# Patient Record
Sex: Male | Born: 1970 | Race: White | Hispanic: No | Marital: Married | Smoking: Former smoker
Health system: Southern US, Community
[De-identification: ages and names within clinical notes are randomized; demographics above are authoritative.]

## PROBLEM LIST (undated history)

## (undated) DIAGNOSIS — M1990 Unspecified osteoarthritis, unspecified site: Secondary | ICD-10-CM

## (undated) DIAGNOSIS — E785 Hyperlipidemia, unspecified: Secondary | ICD-10-CM

## (undated) DIAGNOSIS — Z9989 Dependence on other enabling machines and devices: Secondary | ICD-10-CM

## (undated) DIAGNOSIS — R7303 Prediabetes: Secondary | ICD-10-CM

## (undated) DIAGNOSIS — F32A Depression, unspecified: Secondary | ICD-10-CM

## (undated) DIAGNOSIS — G549 Nerve root and plexus disorder, unspecified: Secondary | ICD-10-CM

## (undated) DIAGNOSIS — R011 Cardiac murmur, unspecified: Secondary | ICD-10-CM

## (undated) DIAGNOSIS — F329 Major depressive disorder, single episode, unspecified: Secondary | ICD-10-CM

## (undated) DIAGNOSIS — J439 Emphysema, unspecified: Secondary | ICD-10-CM

## (undated) DIAGNOSIS — G540 Brachial plexus disorders: Secondary | ICD-10-CM

## (undated) DIAGNOSIS — G2581 Restless legs syndrome: Secondary | ICD-10-CM

## (undated) DIAGNOSIS — R519 Headache, unspecified: Secondary | ICD-10-CM

## (undated) DIAGNOSIS — F419 Anxiety disorder, unspecified: Secondary | ICD-10-CM

## (undated) DIAGNOSIS — I509 Heart failure, unspecified: Secondary | ICD-10-CM

## (undated) DIAGNOSIS — I2510 Atherosclerotic heart disease of native coronary artery without angina pectoris: Secondary | ICD-10-CM

## (undated) DIAGNOSIS — K219 Gastro-esophageal reflux disease without esophagitis: Secondary | ICD-10-CM

## (undated) DIAGNOSIS — R0609 Other forms of dyspnea: Secondary | ICD-10-CM

## (undated) DIAGNOSIS — Z720 Tobacco use: Secondary | ICD-10-CM

## (undated) DIAGNOSIS — J849 Interstitial pulmonary disease, unspecified: Secondary | ICD-10-CM

## (undated) DIAGNOSIS — R351 Nocturia: Secondary | ICD-10-CM

## (undated) DIAGNOSIS — G4733 Obstructive sleep apnea (adult) (pediatric): Secondary | ICD-10-CM

## (undated) DIAGNOSIS — J449 Chronic obstructive pulmonary disease, unspecified: Secondary | ICD-10-CM

## (undated) DIAGNOSIS — N210 Calculus in bladder: Secondary | ICD-10-CM

## (undated) DIAGNOSIS — G4730 Sleep apnea, unspecified: Secondary | ICD-10-CM

## (undated) DIAGNOSIS — R0600 Dyspnea, unspecified: Secondary | ICD-10-CM

## (undated) DIAGNOSIS — Z9289 Personal history of other medical treatment: Secondary | ICD-10-CM

## (undated) DIAGNOSIS — Z8719 Personal history of other diseases of the digestive system: Secondary | ICD-10-CM

## (undated) DIAGNOSIS — R51 Headache: Secondary | ICD-10-CM

## (undated) DIAGNOSIS — M797 Fibromyalgia: Secondary | ICD-10-CM

## (undated) HISTORY — DX: Cardiac murmur, unspecified: R01.1

## (undated) HISTORY — PX: EXPLORATORY LAPAROTOMY: SUR591

## (undated) HISTORY — DX: Brachial plexus disorders: G54.0

## (undated) HISTORY — DX: Atherosclerotic heart disease of native coronary artery without angina pectoris: I25.10

## (undated) HISTORY — DX: Tobacco use: Z72.0

## (undated) HISTORY — DX: Emphysema, unspecified: J43.9

## (undated) HISTORY — DX: Other forms of dyspnea: R06.09

## (undated) HISTORY — DX: Heart failure, unspecified: I50.9

## (undated) HISTORY — DX: Chronic obstructive pulmonary disease, unspecified: J44.9

## (undated) HISTORY — DX: Nerve root and plexus disorder, unspecified: G54.9

## (undated) HISTORY — DX: Sleep apnea, unspecified: G47.30

## (undated) HISTORY — DX: Dyspnea, unspecified: R06.00

## (undated) HISTORY — DX: Anxiety disorder, unspecified: F41.9

## (undated) HISTORY — DX: Restless legs syndrome: G25.81

## (undated) HISTORY — DX: Fibromyalgia: M79.7

## (undated) HISTORY — PX: APPENDECTOMY: SHX54

## (undated) HISTORY — DX: Unspecified osteoarthritis, unspecified site: M19.90

## (undated) HISTORY — PX: COLONOSCOPY: SHX174

---
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
## 1999-01-19 ENCOUNTER — Emergency Department (HOSPITAL_COMMUNITY): Admission: EM | Admit: 1999-01-19 | Discharge: 1999-01-20 | Payer: Self-pay | Admitting: Emergency Medicine

## 2012-06-16 HISTORY — PX: EXPLORATORY LAPAROTOMY: SUR591

## 2013-06-16 DIAGNOSIS — I2510 Atherosclerotic heart disease of native coronary artery without angina pectoris: Secondary | ICD-10-CM

## 2013-06-16 HISTORY — DX: Atherosclerotic heart disease of native coronary artery without angina pectoris: I25.10

## 2013-06-16 HISTORY — PX: CARDIAC CATHETERIZATION: SHX172

## 2014-06-16 HISTORY — PX: OTHER SURGICAL HISTORY: SHX169

## 2014-12-08 ENCOUNTER — Encounter: Payer: Self-pay | Admitting: Internal Medicine

## 2014-12-08 ENCOUNTER — Encounter (INDEPENDENT_AMBULATORY_CARE_PROVIDER_SITE_OTHER): Payer: Self-pay

## 2014-12-08 ENCOUNTER — Ambulatory Visit (INDEPENDENT_AMBULATORY_CARE_PROVIDER_SITE_OTHER): Payer: BLUE CROSS/BLUE SHIELD | Admitting: Internal Medicine

## 2014-12-08 VITALS — BP 114/74 | HR 64 | Temp 98.0°F | Ht 70.0 in | Wt 193.6 lb

## 2014-12-08 DIAGNOSIS — Z720 Tobacco use: Secondary | ICD-10-CM

## 2014-12-08 DIAGNOSIS — F17210 Nicotine dependence, cigarettes, uncomplicated: Secondary | ICD-10-CM

## 2014-12-08 DIAGNOSIS — R0602 Shortness of breath: Secondary | ICD-10-CM | POA: Diagnosis not present

## 2014-12-08 DIAGNOSIS — R0600 Dyspnea, unspecified: Secondary | ICD-10-CM

## 2014-12-08 DIAGNOSIS — R05 Cough: Secondary | ICD-10-CM | POA: Diagnosis not present

## 2014-12-08 DIAGNOSIS — R058 Other specified cough: Secondary | ICD-10-CM

## 2014-12-08 NOTE — Patient Instructions (Addendum)
Stop anoro and arnuity  Be sure you are taking nexium 40 mg Take 30- 60 min before your first and last meals of the day and pepcid 20 mg at bedtime until return  GERD (REFLUX)  is an extremely common cause of respiratory symptoms just like yours , many times with no obvious heartburn at all.    It can be treated with medication, but also with lifestyle changes including elevation of the head of your bed (ideally with 6 inch  bed blocks),  Smoking cessation, avoidance of late meals, excessive alcohol, and avoid fatty foods, chocolate, peppermint, colas, red wine, and acidic juices such as orange juice.  NO MINT OR MENTHOL PRODUCTS SO NO COUGH DROPS  USE SUGARLESS CANDY INSTEAD (Jolley ranchers or Stover's or Life Savers) or even ice chips will also do - the key is to swallow to prevent all throat clearing. NO OIL BASED VITAMINS - use powdered substitutes.  Dulera 100 Take 2 puffs first thing in am and then another 2 puffs about 12 hours later.   Only use your xopenex as a rescue medication to be used if you can't catch your breath by resting or doing a relaxed purse lip breathing pattern.  - The less you use it, the better it will work when you need it. - Ok to use up to 2 puffs  every 4 hours if you must but call for immediate appointment if use goes up over your usual need - Don't leave home without it !!  (think of it like the spare tire for your car)   Please schedule a follow up office visit in 2 weeks, sooner if needed

## 2014-12-08 NOTE — Progress Notes (Signed)
   Subjective:    Patient ID: Kevin Ferguson, male    DOB: 1971-05-24,    MRN: [id number]  HPI  61 yowm active smoker with sob since 2008 dx as copd/ab by [facility] > no better with rx > [facility] eval/ cards eval with neg [facility] 2014  rx incruse/arnuity > not helping and self referred to pulmonary clinic 12/08/2014    12/08/2014 1st [facility] office visit/ Wert   Chief Complaint  Patient presents with  . self referral    DX w/ COPD/asthma by Dr. Grover Canavan in [place]. C/o SOB all the time.  ok at rest / uses cpap with lots of coughing at hs / 50 ft sob x years but gradually getting worse/ cough mostly dry assoc with nasal congestin and sense of pnds. Using saba up to 10 x daily while on incruse/ arnuity but doesn't feel saba works anymore   No obvious other patterns in day to day or daytime variabilty or assoc  cp or overt   hb symptoms. No unusual exp hx or h/o childhood pna/ asthma or knowledge of premature birth.  . Also denies any obvious fluctuation of symptoms with weather or environmental changes or other aggravating or alleviating factors except as outlined above   Current Medications, Allergies, Complete Past Medical History, Past Surgical History, Family History, and Social History were reviewed in [employer] record.            Review of Systems  Constitutional: Negative for fever, chills, activity change, appetite change and unexpected weight change.  HENT: Positive for congestion and postnasal drip. Negative for dental problem, rhinorrhea, sneezing, sore throat, trouble swallowing and voice change.   Eyes: Negative for visual disturbance.  Respiratory: Positive for cough, chest tightness, shortness of breath and wheezing. Negative for choking.   Cardiovascular: Positive for leg swelling. Negative for chest pain.  Gastrointestinal: Negative for nausea, vomiting and abdominal pain.  Genitourinary: Negative for difficulty urinating.  Musculoskeletal: Negative  for arthralgias.  Skin: Negative for rash.  Psychiatric/Behavioral: Negative for behavioral problems and confusion.       Objective:   Physical Exam  Wt Readings from Last 3 Encounters:  12/08/14 193 lb 9.6 oz (87.816 kg)    Vital signs reviewed   amb wm freq throat clearing   HEENT: nl dentition, turbinates, and orophanx. Nl external ear canals without cough reflex   NECK :  without JVD/Nodes/TM/ nl carotid upstrokes bilaterally   LUNGS: no acc muscle use, clear to A and P bilaterally without cough on insp or exp maneuvers - pseudowheeze only    CV:  RRR  no s3 or murmur or increase in P2, no edema   ABD:  soft and nontender with nl excursion in the supine position. No bruits or organomegaly, bowel sounds nl  MS:  warm without deformities, calf tenderness, cyanosis or clubbing  SKIN: warm and dry without lesions    NEURO:  alert, approp, no deficits     No cxr on file, done at [facility] per pt w/in a few months        Assessment & Plan:

## 2014-12-09 DIAGNOSIS — F17210 Nicotine dependence, cigarettes, uncomplicated: Secondary | ICD-10-CM | POA: Insufficient documentation

## 2014-12-09 DIAGNOSIS — R0600 Dyspnea, unspecified: Secondary | ICD-10-CM | POA: Insufficient documentation

## 2014-12-09 DIAGNOSIS — R058 Other specified cough: Secondary | ICD-10-CM | POA: Insufficient documentation

## 2014-12-09 DIAGNOSIS — Z87891 Personal history of nicotine dependence: Secondary | ICD-10-CM | POA: Insufficient documentation

## 2014-12-09 DIAGNOSIS — R05 Cough: Secondary | ICD-10-CM | POA: Insufficient documentation

## 2014-12-09 MED ORDER — FAMOTIDINE 20 MG PO TABS: ORAL_TABLET | ORAL | Status: DC

## 2014-12-09 NOTE — Assessment & Plan Note (Signed)
-   spirometry 12/08/14 with non physiologic f/v c/w vcd - d/c'd all dpi's and changed to dulera 100 2bid 12/08/14 and max gerd rx   The most common causes of chronic cough in immunocompetent adults include the following: upper airway cough syndrome (UACS), previously referred to as postnasal drip syndrome (PNDS), which is caused by variety of rhinosinus conditions; (2) asthma; (3) GERD; (4) chronic bronchitis from cigarette smoking or other inhaled environmental irritants; (5) nonasthmatic eosinophilic bronchitis; and (6) bronchiectasis.   These conditions, singly or in combination, have accounted for up to 94% of the causes of chronic cough in prospective studies.   Other conditions have constituted no >6% of the causes in prospective studies These have included bronchogenic carcinoma, chronic interstitial pneumonia, sarcoidosis, left ventricular failure, ACEI-induced cough, and aspiration from a condition associated with pharyngeal dysfunction.    Chronic cough is often simultaneously caused by more than one condition. A single cause has been found from 38 to 82% of the time, multiple causes from 18 to 62%. Multiply caused cough has been the result of three diseases up to 42% of the time.       Based on hx and exam, this is most likely:  Classic Upper airway cough syndrome, so named because it's frequently impossible to sort out how much is  CR/sinusitis with freq throat clearing (which can be related to primary GERD)   vs  causing  secondary (" extra esophageal")  GERD from wide swings in gastric pressure that occur with throat clearing, often  promoting self use of mint and menthol lozenges that reduce the lower esophageal sphincter tone and exacerbate the problem further in a cyclical fashion.   These are the same pts (now being labeled as having "irritable larynx syndrome" by some cough centers) who not infrequently have a history of having failed to tolerate ace inhibitors,  dry powder inhalers or  biphosphonates or report having atypical reflux symptoms that don't respond to standard doses of PPI , and are easily confused as having aecopd or asthma flares by even experienced allergists/ pulmonologists.   The first step is to maximize acid suppression and eliminate dpi  then regroup in 2 weeks  I had an extended discussion with the patient and wife  reviewing all relevant studies completed to date x 35 m  - The proper method of use, as well as anticipated side effects, of a metered-dose inhaler are discussed and demonstrated to the patient. Improved effectiveness after extensive coaching during this visit to a level of approximately  90% - Each maintenance medication was reviewed in detail including most importantly the difference between maintenance and as needed and under what circumstances the prns are to be used.  Please see instructions for details which were reviewed in writing and the patient given a copy.

## 2014-12-09 NOTE — Assessment & Plan Note (Signed)
12/08/2014  Walked RA x 3 laps @ 185 ft each stopped due to  End of study/ minimal sob/ no desat at slow pace  Symptoms are markedly disproportionate to objective findings and not clear this is a lung problem but pt does appear to have difficult airway management issues. DDX of  difficult airways management all start with A and  include Adherence, Ace Inhibitors, Acid Reflux, Active Sinus Disease, Alpha 1 Antitripsin deficiency, Anxiety masquerading as Airways dz,  ABPA,  allergy(esp in young), Aspiration (esp in elderly), Adverse effects of meds,  Active smokers, A bunch of PE's (a small clot burden can't cause this syndrome unless there is already severe underlying pulm or vascular dz with poor reserve) plus two Bs  = Bronchiectasis and Beta blocker use..and one C= CHF   Adherence is always the initial "prime suspect" and is a multilayered concern that requires a "trust but verify" approach in every patient - starting with knowing how to use medications, especially inhalers, correctly, keeping up with refills and understanding the fundamental difference between maintenance and prns vs those medications only taken for a very short course and then stopped and not refilled.   Active smoking > see sep a/p  ? Acid (or non-acid) GERD > always difficult to exclude as up to 75% of pts in some series report no assoc GI/ Heartburn symptoms> rec max (24h)  acid suppression and diet restrictions/ reviewed and instructions given in writing.   ? Adverse effects of dpi > d/c  ? Anxiety > dx of exclusion but much higher on his list   ? Chf/ihd  > excluded at [facility]

## 2014-12-09 NOTE — Assessment & Plan Note (Signed)

## 2014-12-15 HISTORY — PX: OTHER SURGICAL HISTORY: SHX169

## 2014-12-22 ENCOUNTER — Ambulatory Visit (INDEPENDENT_AMBULATORY_CARE_PROVIDER_SITE_OTHER): Payer: BLUE CROSS/BLUE SHIELD | Admitting: Internal Medicine

## 2014-12-22 ENCOUNTER — Encounter: Payer: Self-pay | Admitting: Internal Medicine

## 2014-12-22 ENCOUNTER — Other Ambulatory Visit (INDEPENDENT_AMBULATORY_CARE_PROVIDER_SITE_OTHER): Payer: BLUE CROSS/BLUE SHIELD

## 2014-12-22 ENCOUNTER — Ambulatory Visit (INDEPENDENT_AMBULATORY_CARE_PROVIDER_SITE_OTHER)
Admission: RE | Admit: 2014-12-22 | Discharge: 2014-12-22 | Disposition: A | Discharge status: Home / Self Care | Payer: BLUE CROSS/BLUE SHIELD | Source: Ambulatory Visit | Attending: Internal Medicine | Admitting: Internal Medicine

## 2014-12-22 VITALS — BP 104/78 | HR 78 | Ht 70.0 in | Wt 193.8 lb

## 2014-12-22 DIAGNOSIS — R05 Cough: Secondary | ICD-10-CM | POA: Diagnosis not present

## 2014-12-22 DIAGNOSIS — F17210 Nicotine dependence, cigarettes, uncomplicated: Secondary | ICD-10-CM

## 2014-12-22 DIAGNOSIS — R0600 Dyspnea, unspecified: Secondary | ICD-10-CM

## 2014-12-22 DIAGNOSIS — Z720 Tobacco use: Secondary | ICD-10-CM | POA: Diagnosis not present

## 2014-12-22 DIAGNOSIS — R058 Other specified cough: Secondary | ICD-10-CM

## 2014-12-22 LAB — CBC WITH DIFFERENTIAL/PLATELET
BASOS ABS: 0.1 10*3/uL (ref 0.0–0.1)
Basophils Relative: 0.5 % (ref 0.0–3.0)
Eosinophils Absolute: 0.2 10*3/uL (ref 0.0–0.7)
Eosinophils Relative: 1.7 % (ref 0.0–5.0)
HCT: 47.1 % (ref 39.0–52.0)
Hemoglobin: 15.9 g/dL (ref 13.0–17.0)
LYMPHS PCT: 28.1 % (ref 12.0–46.0)
Lymphs Abs: 2.8 10*3/uL (ref 0.7–4.0)
MCHC: 33.9 g/dL (ref 30.0–36.0)
MCV: 94.4 fl (ref 78.0–100.0)
MONO ABS: 1 10*3/uL (ref 0.1–1.0)
Monocytes Relative: 9.9 % (ref 3.0–12.0)
NEUTROS PCT: 59.8 % (ref 43.0–77.0)
Neutro Abs: 6 10*3/uL (ref 1.4–7.7)
PLATELETS: 318 10*3/uL (ref 150.0–400.0)
RBC: 4.99 Mil/uL (ref 4.22–5.81)
RDW: 14.1 % (ref 11.5–15.5)
WBC: 10.1 10*3/uL (ref 4.0–10.5)

## 2014-12-22 LAB — BRAIN NATRIURETIC PEPTIDE: Pro B Natriuretic peptide (BNP): 14 pg/mL (ref 0.0–100.0)

## 2014-12-22 LAB — BASIC METABOLIC PANEL
BUN: 13 mg/dL (ref 6–23)
CO2: 28 mEq/L (ref 19–32)
Calcium: 9.7 mg/dL (ref 8.4–10.5)
Chloride: 105 mEq/L (ref 96–112)
Creatinine, Ser: 0.99 mg/dL (ref 0.40–1.50)
GFR: 88.12 mL/min (ref >60.00)
Glucose, Bld: 87 mg/dL (ref 70–99)
POTASSIUM: 4 meq/L (ref 3.5–5.1)
SODIUM: 140 meq/L (ref 135–145)

## 2014-12-22 LAB — TSH: TSH: 1.52 u[IU]/mL (ref 0.35–4.50)

## 2014-12-22 MED ORDER — MOMETASONE FURO-FORMOTEROL FUM 100-5 MCG/ACT IN AERO: INHALATION_SPRAY | RESPIRATORY_TRACT | Status: DC

## 2014-12-22 NOTE — Progress Notes (Signed)
Subjective:    Patient ID: Kevin Ferguson, male    DOB: [birth year],    MRN: [id number]    Brief patient profile:  56 yowm active smoker with sob since 2008 dx as copd/ab by Kevin Ferguson > no better with rx > Kevin Ferguson eval/ cards eval with neg LHC 2014  rx incruse/arnuity > not helping and self referred to pulmonary clinic 12/08/2014 with ? Malingering?    History of Present Illness  12/08/2014 1st [facility] office visit/ Kevin Ferguson   Chief Complaint  Patient presents with  . self referral    DX w/ COPD/asthma by Dr. Grover Ferguson in [place]. C/o SOB all the time.  ok at rest / uses cpap with lots of coughing at hs / 50 ft sob x years but gradually getting worse/ cough mostly dry assoc with nasal congestin and sense of pnds. Using saba up to 10 x daily while on incruse/ arnuity but doesn't feel saba works anymore  rec Stop incruse and arnuity Be sure you are taking nexium 40 mg Take 30- 60 min before your first and last meals of the day and pepcid 20 mg at bedtime until return GERD diet . Dulera 100 Take 2 puffs first thing in am and then another 2 puffs about 12 hours later.  Only use your xopenex as a rescue medication    12/22/2014 f/u ov/Kevin Ferguson re: ? Mild copd/ ? Malingering  Chief Complaint  Patient presents with  . Follow-up    Pt states that his breathing is unchanged. No new co's today.  He is using xopenex 5-6 x per day.    sleeps ok on cpap no need for noct saba  But when walks across the room to br > sob just getting there xopenex 3 h prior  To ov and senses needed it on arrival  Less rescue since stopped anoro/arnuity  No obvious day to day or daytime variability or assoc chronic cough or cp or chest tightness, subjective wheeze or overt sinus or hb symptoms. No unusual exp hx or h/o childhood pna/ asthma or knowledge of premature birth.  Sleeping ok without nocturnal  or early am exacerbation  of respiratory  c/o's or need for noct saba. Also denies any obvious fluctuation of symptoms  with weather or environmental changes or other aggravating or alleviating factors except as outlined above   Current Medications, Allergies, Complete Past Medical History, Past Surgical History, Family History, and Social History were reviewed in [employer] record.  ROS  The following are not active complaints unless bolded sore throat, dysphagia, dental problems, itching, sneezing,  nasal congestion or excess/ purulent secretions, ear ache,   fever, chills, sweats, unintended wt loss, classically pleuritic or exertional cp, hemoptysis,  orthopnea pnd or leg swelling, presyncope, palpitations, abdominal pain, anorexia, nausea, vomiting, diarrhea  or change in bowel or bladder habits, change in stools or urine, dysuria,hematuria,  rash, arthralgias, visual complaints, headache, numbness, weakness or ataxia or problems with walking or coordination,  change in mood/affect or memory.            Objective:   Physical Exam   amb wm / hopeless helpless affect    Wt Readings from Last 3 Encounters:  12/22/14 193 lb 12.8 oz (87.907 kg)  12/08/14 193 lb 9.6 oz (87.816 kg)    Vital signs reviewed   HEENT: nl dentition, turbinates, and orophanx. Nl external ear canals without cough reflex   NECK :  without JVD/Nodes/TM/ nl carotid upstrokes bilaterally  LUNGS: no acc muscle use, clear to A and P bilaterally without cough on insp or exp maneuvers      CV:  RRR  no s3 or murmur or increase in P2, no edema   ABD:  soft and nontender with nl excursion in the supine position. No bruits or organomegaly, bowel sounds nl  MS:  warm without deformities, calf tenderness, cyanosis or clubbing  SKIN: warm and dry without lesions    NEURO:  alert, approp, no deficits      CXR PA and Lateral:   12/22/2014 :     I personally reviewed images and agree with radiology impression as follows:    No edema or consolidation. Shallow degree of inspiration. Apparent postoperative  change right apex region.  Labs ordered/ reviewed:   Lab 12/22/14 1643  NA 140  K 4.0  CL 105  CO2 28  BUN 13  CREATININE 0.99  GLUCOSE 87    Lab 12/22/14 1643  HGB 15.9  HCT 47.1  WBC 10.1  PLT 318.0     Lab Results  Component Value Date   TSH 1.52 12/22/2014     Lab Results  Component Value Date   PROBNP 14.0 12/22/2014              Assessment & Plan:

## 2014-12-22 NOTE — Patient Instructions (Signed)
Continue dulera 100 Take 2 puffs first thing in am and then another 2 puffs about 12 hours later.   Only use your albuterol as a rescue medication to be used if you can't catch your breath by resting or doing a relaxed purse lip breathing pattern.  - The less you use it, the better it will work when you need it. - Ok to use up to 2 puffs  every 4 hours if you must but call for immediate appointment if use goes up over your usual need - Don't leave home without it !!  (think of it like the spare tire for your car)   The key is to stop smoking completely before smoking completely stops you!   GERD (REFLUX)  is an extremely common cause of respiratory symptoms just like yours , many times with no obvious heartburn at all.    It can be treated with medication, but also with lifestyle changes including elevation of the head of your bed (ideally with 6 inch  bed blocks),  Smoking cessation, avoidance of late meals, excessive alcohol, and avoid fatty foods, chocolate, peppermint, colas, red wine, and acidic juices such as orange juice.  NO MINT OR MENTHOL PRODUCTS SO NO COUGH DROPS  USE SUGARLESS CANDY INSTEAD (Jolley ranchers or Stover's or Life Savers) or even ice chips will also do - the key is to swallow to prevent all throat clearing. NO OIL BASED VITAMINS - use powdered substitutes.  Please remember to go to the lab and x-ray department downstairs for your tests - we will call you with the results when they are available.      Please schedule a follow up office visit in 6 weeks, call sooner if needed bring all active medications with you - all of them

## 2014-12-23 NOTE — Assessment & Plan Note (Addendum)
12/08/2014  Walked RA x 3 laps @ 185 ft each stopped due to  End of study/ minimal sob/ no desat at slow pace - spirometry 12/08/14 non phys FEV1 1.88 on arnuity and incruse - spirometry 12/22/14 non phys FEV1 2.17 on dulera 100 2bid - 12/23/2014  Walked RA x 3 laps @ 185 ft each stopped due to  End of study, no tachypnea, sats 99%  Slow pace refused to walk faster "I'll give out"  I had an extended discussion with the patient reviewing all relevant studies completed to date and  lasting 15 to 20 minutes of a 25 minute visit    1) smoking and gerd need to be managed agressively but I really don't think he should be limited from all but high level activity - walking across a room does not count in this regard and witnessing his walking study strongly suggested malingering to me   2) Each maintenance medication was reviewed in detail including most importantly the difference between maintenance and prns and under what circumstances the prns are to be triggered using an action plan format that is not reflected in the computer generated alphabetically organized AVS.    Please see instructions for details which were reviewed in writing and the patient given a copy highlighting the part that I personally wrote and discussed at today's ov.

## 2014-12-23 NOTE — Assessment & Plan Note (Signed)

## 2014-12-23 NOTE — Assessment & Plan Note (Signed)
-   spirometry 12/08/14 with non physiologic f/v c/w vcd - d/c'd all dpi's and changed to dulera 100 2bid 12/08/14 and max gerd rx > less throat clearing 12/22/14   sense of  pnds is day > noct with urge to clear throat disprorportionate to mucus production which is minimal.   This is c/w  Classic Upper airway cough syndrome, so named because it's frequently impossible to sort out how much is  CR/sinusitis with freq throat clearing (which can be related to primary GERD)   vs  causing  secondary (" extra esophageal")  GERD from wide swings in gastric pressure that occur with throat clearing, often  promoting self use of mint and menthol lozenges that reduce the lower esophageal sphincter tone and exacerbate the problem further in a cyclical fashion.   These are the same pts (now being labeled as having "irritable larynx syndrome" by some cough centers) who not infrequently have a history of having failed to tolerate ace inhibitors,  dry powder inhalers or biphosphonates or report having atypical reflux symptoms that don't respond to standard doses of PPI , and are easily confused as having aecopd or asthma flares by even experienced allergists/ pulmonologists.   rec continue max gerd rx / hard rock candy/ minimize ICS

## 2014-12-25 LAB — ALLERGY FULL PROFILE
Allergen, D pternoyssinus,d7: <0.1 kU/L
Allergen,Goose feathers, e70: <0.1 kU/L
Alternaria Alternata: <0.1 kU/L
Bahia Grass: <0.1 kU/L
Bermuda Grass: <0.1 kU/L
Box Elder IgE: <0.1 kU/L
Candida Albicans: <0.1 kU/L
Cat Dander: <0.1 kU/L
Common Ragweed: <0.1 kU/L
Curvularia lunata: <0.1 kU/L
D. farinae: <0.1 kU/L
Dog Dander: 0.17 kU/L — ABNORMAL HIGH
Elm IgE: <0.1 kU/L
G005 Rye, Perennial: <0.1 kU/L
IgE (Immunoglobulin E), Serum: 14 kU/L (ref <115)
Lamb's Quarters: <0.1 kU/L
Oak: <0.1 kU/L
Stemphylium Botryosum: <0.1 kU/L
Sycamore Tree: <0.1 kU/L
Timothy Grass: <0.1 kU/L

## 2014-12-25 NOTE — Progress Notes (Signed)
Quick Note:  Spoke with pt and notified of results per Dr. Wert. Pt verbalized understanding and denied any questions.  ______ 

## 2014-12-28 ENCOUNTER — Telehealth: Payer: Self-pay | Admitting: Internal Medicine

## 2014-12-28 DIAGNOSIS — R0600 Dyspnea, unspecified: Secondary | ICD-10-CM

## 2014-12-28 NOTE — Telephone Encounter (Signed)
I do not have any records from Dr Gwenyth Allegra  stongly rec CPST but he'll need to be walking regularly at the pace he did here on a flat a/c surface like a mall or won't be able to stay on the exercise equipment long enough to sort out  Other option is referral to [facility]

## 2014-12-28 NOTE — Telephone Encounter (Signed)
lmtcb

## 2014-12-28 NOTE — Telephone Encounter (Signed)
Pt seen on 12/22/14.  1)He is wanting to know if we received his breathing test report from [facility]? 2) wants to know what the next step is to find out what is causing his SOB that is stopping him from being able to do physical activity. He reports he isn't able to work bc he can't breath.  Please advise MW thanks

## 2014-12-29 NOTE — Telephone Encounter (Signed)
lmtcb x2 for pt. 

## 2015-01-01 NOTE — Telephone Encounter (Signed)
lmtcb for pt.  

## 2015-01-02 NOTE — Telephone Encounter (Signed)
Spoke with pt and advised of Dr wert's recommendations.  Pt wants to proceed with CPST.  Order placed.

## 2015-01-04 ENCOUNTER — Ambulatory Visit (HOSPITAL_COMMUNITY): Payer: BLUE CROSS/BLUE SHIELD | Attending: Internal Medicine

## 2015-01-04 DIAGNOSIS — R0600 Dyspnea, unspecified: Secondary | ICD-10-CM | POA: Diagnosis not present

## 2015-01-08 NOTE — Progress Notes (Signed)
Quick Note:  Spoke with pt and notified of results per Dr. Wert. Pt verbalized understanding and denied any questions.  ______ 

## 2015-01-09 ENCOUNTER — Encounter: Payer: Self-pay | Admitting: Internal Medicine

## 2015-01-10 NOTE — Telephone Encounter (Signed)
ROV has been moved up to tomorrow at 10:15am with MR.

## 2015-01-11 ENCOUNTER — Ambulatory Visit (INDEPENDENT_AMBULATORY_CARE_PROVIDER_SITE_OTHER): Payer: BLUE CROSS/BLUE SHIELD | Admitting: Internal Medicine

## 2015-01-11 ENCOUNTER — Encounter: Payer: Self-pay | Admitting: Internal Medicine

## 2015-01-11 VITALS — BP 142/98 | HR 90 | Ht 69.0 in | Wt 196.8 lb

## 2015-01-11 DIAGNOSIS — R0600 Dyspnea, unspecified: Secondary | ICD-10-CM

## 2015-01-11 NOTE — Patient Instructions (Addendum)
ICD-9-CM ICD-10-CM   1. Dyspnea 786.09 R06.00    Do ct chest If negative then will refer right heart cath If Right heart cath also negative, will refer to [facility] pulmonary clinic

## 2015-01-11 NOTE — Progress Notes (Signed)
Subjective:    Patient ID: Kevin Ferguson, male    DOB: Jun 03, 1971, 44 y.o.   MRN: [id number]  HPI    Brief patient profile:  10 yowm active smoker with sob since 2008 dx as copd/ab by [facility] > no better with rx > [facility] eval/ cards eval with neg [facility] 2014  rx incruse/arnuity > not helping and self referred to pulmonary clinic 12/08/2014 with ? Malingering?    History of Present Illness  12/08/2014 1st [facility] office visit/ [facility]   Chief Complaint  Patient presents with  . self referral    DX w/ COPD/asthma by Dr. Grover Canavan in [place]. C/o SOB all the time.  ok at rest / uses cpap with lots of coughing at hs / 50 ft sob x years but gradually getting worse/ cough mostly dry assoc with nasal congestin and sense of pnds. Using saba up to 10 x daily while on incruse/ arnuity but doesn't feel saba works anymore  rec Stop incruse and arnuity Be sure you are taking nexium 40 mg Take 30- 60 min before your first and last meals of the day and pepcid 20 mg at bedtime until return GERD diet . Dulera 100 Take 2 puffs first thing in am and then another 2 puffs about 12 hours later.  Only use your xopenex as a rescue medication    12/22/2014 f/u ov/[facility] re: ? Mild copd/ ? Malingering  Chief Complaint  Patient presents with  . Follow-up    Pt states that his breathing is unchanged. No new co's today.  He is using xopenex 5-6 x per day.    sleeps ok on cpap no need for noct saba  But when walks across the room to br > sob just getting there xopenex 3 h prior  To ov and senses needed it on arrival  Less rescue since stopped anoro/arnuity  No obvious day to day or daytime variability or assoc chronic cough or cp or chest tightness, subjective wheeze or overt sinus or hb symptoms. No unusual exp hx or h/o childhood pna/ asthma or knowledge of premature birth.  Sleeping ok without nocturnal  or early am exacerbation  of respiratory  c/o's or need for noct saba. Also denies any obvious fluctuation  of symptoms with weather or environmental changes or other aggravating or alleviating factors except as outlined above         OV 01/11/2015  Chief Complaint  Patient presents with  . Follow-up    Pt of MW's. Pt here to discuss CPST results. Pt c/o DOE, mild dry cough, and chest discomfort.     Follow-up dyspnea - Dr. Melvyn Novas referred him to me for dyspnea evaluation and interpretation of r pulmonary stress test  Patient presents with his wife. History retake from both of them and review of Dr. Gustavus Bryant medical records. He reports several years of dyspnea progressively worse. Currently severe. Worsened by exertion and heat but relieved by rest. Unable to sleep floors climb a flight of stairs a work lifting heavy objects. There is associated wheezing and dry cough. The quality of the cough is dry. Inhaler therapy through [facility] and Dr. Grover Canavan at [facility] have not helped even though he is continuing to take it. 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 He has paresthesias on his left side He does  report early 2016 cardiac stress test evaluation with cardiac stress echo that reportedly was normal and he was discharged from follow-up. There is associated fibromyalgia. He also recollects ENT evaluation at [facility] and this was normal. He also recollects blood test for autoimmune workup at 5. clinical practice was normal.  Cardiac pulmonary stress test that I personally reviewed Done 01/05/2015 shows submaximal effort and therefore uninterpretable. All parameters are significantly diminished.  Of note he denies any CT scan of the chest or right heart catheterization       Review of Systems  Constitutional: Negative for fever and unexpected weight change.  HENT: Positive for congestion. Negative for dental  problem, ear pain, nosebleeds, postnasal drip, rhinorrhea, sinus pressure, sneezing, sore throat and trouble swallowing.   Eyes: Negative for redness and itching.  Respiratory: Positive for cough, chest tightness and shortness of breath. Negative for wheezing.   Cardiovascular: Positive for palpitations and leg swelling.  Gastrointestinal: Negative for nausea and vomiting.  Genitourinary: Negative for dysuria.  Musculoskeletal: Negative for joint swelling.  Skin: Negative for rash.  Neurological: Negative for headaches.  Hematological: Does not bruise/bleed easily.  Psychiatric/Behavioral: Negative for dysphoric mood. The patient is not nervous/anxious.        Objective:   Physical Exam  Constitutional: He is oriented to person, place, and time. He appears well-developed and well-nourished. No distress.  HENT:  Head: Normocephalic and atraumatic.  Right Ear: External ear normal.  Left Ear: External ear normal.  Mouth/Throat: Oropharynx is clear and moist. No oropharyngeal exudate.  Eyes: Conjunctivae and EOM are normal. Pupils are equal, round, and reactive to light. Right eye exhibits no discharge. Left eye exhibits no discharge. No scleral icterus.  Neck: Normal range of motion. Neck supple. No JVD present. No tracheal deviation present. No thyromegaly present.  Cardiovascular: Normal rate, regular rhythm and intact distal pulses.  Exam reveals no gallop and no friction rub.   No murmur heard. Pulmonary/Chest: Effort normal and breath sounds normal. No respiratory distress. He has no wheezes. He has no rales. He exhibits no tenderness.  Abdominal: Soft. Bowel sounds are normal. He exhibits no distension and no mass. There is no tenderness. There is no rebound and no guarding.  Musculoskeletal: Normal range of motion. He exhibits no edema or tenderness.  Lymphadenopathy:    He has no cervical adenopathy.  Neurological: He is alert and oriented to person, place, and time. He has normal  reflexes. No cranial nerve deficit. Coordination normal.  Well-built muscular No fasciculations  Skin: Skin is warm and dry. No rash noted. He is not diaphoretic. No erythema. No pallor.  Psychiatric: Judgment normal.  Flat affect  Nursing note and vitals reviewed.   Filed Vitals:   01/11/15 1034  BP: 142/98  Pulse: 90  Height: 5\' 9"  (1.753 m)  Weight: 196 lb 12.8 oz (89.268 kg)  SpO2: 97%          Assessment & Plan:     ICD-9-CM ICD-10-CM   1. Dyspnea 786.09 R06.00 CT Chest High Resolution   Unclear cause of dyspnea. Multiple symptomatology. Doubt if a cause will be found. Also is having care at multiple places. It is best that he some consolidates his care at [place]. In this case he would prefer [facility]. For now will get high-resolution CT scan of the chest and if this is negative right heart catheterization. If all these are negative referred [facility] pulmonary  (> 50% of this 15 min visit  spent in face to face counseling or/and coordination of care)    Dr. Brand Males, M.D., [facility].C.P Pulmonary and Critical Care Medicine Staff Physician [facility] Pager: [phone], If no answer or between  15:00h - 7:00h: call [phone]  01/11/2015 11:15 AM

## 2015-01-15 ENCOUNTER — Ambulatory Visit (INDEPENDENT_AMBULATORY_CARE_PROVIDER_SITE_OTHER)
Admission: RE | Admit: 2015-01-15 | Discharge: 2015-01-15 | Disposition: A | Discharge status: Home / Self Care | Payer: BLUE CROSS/BLUE SHIELD | Source: Ambulatory Visit | Attending: Internal Medicine | Admitting: Internal Medicine

## 2015-01-15 DIAGNOSIS — R0600 Dyspnea, unspecified: Secondary | ICD-10-CM | POA: Diagnosis not present

## 2015-01-21 ENCOUNTER — Encounter: Payer: Self-pay | Admitting: Internal Medicine

## 2015-01-21 DIAGNOSIS — R0600 Dyspnea, unspecified: Secondary | ICD-10-CM

## 2015-01-21 NOTE — Telephone Encounter (Signed)
Let Kevin Ferguson know that CT chest possibly shows inflammation in lungs per radiologist. IF true, can explain shortness of breath. This type of problem is called ILD and there are many varieties of this. COuld be related to his smoking but we need to figure out   Plan  - do full PFT - he has never had this ; he has only had spiro in past - any location asap - do Serum: ESR, ACE, ANA, DS-DNA, RF, anti-CCP, ssA, ssB, scl-70, ANCA screen, MPO, PR-3, Total CK,  RNP, Aldolase,  Hypersensitivity Pneumonitis Panel - he says he has had autoimmune profile but he does not know when and whre and doubt if comprehensive. So, if not a $ issue for him he needs to have above done  - FU to see me after above

## 2015-01-23 ENCOUNTER — Encounter: Payer: Self-pay | Admitting: Internal Medicine

## 2015-01-23 NOTE — Telephone Encounter (Signed)
Called spoke with pt and is aware of CT results. He is fine with having PFT done and is scheduled for 8/16 at 10 over at [facility]. Labs have been ordered as well. Pt will have this done down in lab.  Daneil Dan, pt needs an appt to see MR please advise thanks

## 2015-01-23 NOTE — Telephone Encounter (Signed)
Please advise MR thanks 

## 2015-01-24 ENCOUNTER — Other Ambulatory Visit (INDEPENDENT_AMBULATORY_CARE_PROVIDER_SITE_OTHER): Payer: BLUE CROSS/BLUE SHIELD

## 2015-01-24 DIAGNOSIS — R0600 Dyspnea, unspecified: Secondary | ICD-10-CM

## 2015-01-24 LAB — SEDIMENTATION RATE: Sed Rate: 8 mm/hr (ref 0–22)

## 2015-01-24 NOTE — Telephone Encounter (Signed)
Til he finishes lab work and PFT and comes to me for fu I cannot and do not have better advise other than quitting smoking. Pls ensure fu with me

## 2015-01-25 LAB — SJOGRENS SYNDROME-A EXTRACTABLE NUCLEAR ANTIBODY: SSA (RO) (ENA) ANTIBODY, IGG: NEGATIVE

## 2015-01-25 LAB — MPO/PR-3 (ANCA) ANTIBODIES

## 2015-01-25 LAB — CK TOTAL AND CKMB (NOT AT ARMC)
CK, MB: 4.8 ng/mL (ref 0.0–5.0)
RELATIVE INDEX: 1.2 (ref 0.0–4.0)
Total CK: 391 U/L — ABNORMAL HIGH (ref 7–232)

## 2015-01-25 LAB — RHEUMATOID FACTOR: Rhuematoid fact SerPl-aCnc: <10 IU/mL (ref <=14)

## 2015-01-25 LAB — ANTI-DNA ANTIBODY, DOUBLE-STRANDED: ds DNA Ab: 1 IU/mL

## 2015-01-25 LAB — ANCA SCREEN W REFLEX TITER: ANCA SCREEN: NEGATIVE

## 2015-01-25 LAB — RNP ANTIBODY: Ribonucleic Protein(ENA) Antibody, IgG: <1

## 2015-01-25 LAB — ANA: Anti Nuclear Antibody(ANA): NEGATIVE

## 2015-01-25 LAB — CYCLIC CITRUL PEPTIDE ANTIBODY, IGG

## 2015-01-25 LAB — ANTI-SCLERODERMA ANTIBODY: Scleroderma (Scl-70) (ENA) Antibody, IgG: <1

## 2015-01-25 LAB — SJOGRENS SYNDROME-B EXTRACTABLE NUCLEAR ANTIBODY: SSB (La) (ENA) Antibody, IgG: <1

## 2015-01-25 LAB — ANGIOTENSIN CONVERTING ENZYME: Angiotensin-Converting Enzyme: 73 U/L — ABNORMAL HIGH (ref 8–52)

## 2015-01-27 LAB — ALDOLASE: Aldolase: 7.5 U/L (ref <=8.1)

## 2015-01-30 ENCOUNTER — Ambulatory Visit (HOSPITAL_COMMUNITY)
Admission: RE | Admit: 2015-01-30 | Discharge: 2015-01-30 | Disposition: A | Discharge status: Home / Self Care | Payer: BLUE CROSS/BLUE SHIELD | Source: Ambulatory Visit | Attending: Internal Medicine | Admitting: Internal Medicine

## 2015-01-30 DIAGNOSIS — R0600 Dyspnea, unspecified: Secondary | ICD-10-CM | POA: Diagnosis not present

## 2015-01-30 LAB — PULMONARY FUNCTION TEST
DL/VA % pred: 95 %
DL/VA: 4.47 ml/min/mmHg/L
DLCO unc % pred: 65 %
DLCO unc: 21.24 ml/min/mmHg
FEF 25-75 POST: 4.2 L/s
FEF 25-75 Pre: 3.97 L/sec
FEF2575-%CHANGE-POST: 5 %
FEF2575-%PRED-PRE: 102 %
FEF2575-%Pred-Post: 108 %
FEV1-%Change-Post: 4 %
FEV1-%PRED-POST: 76 %
FEV1-%Pred-Pre: 72 %
FEV1-Post: 3.16 L
FEV1-Pre: 3.02 L
FEV1FVC-%CHANGE-POST: 7 %
FEV1FVC-%Pred-Pre: 105 %
FEV6-%CHANGE-POST: 1 %
FEV6-%PRED-POST: 69 %
FEV6-%Pred-Pre: 68 %
FEV6-Post: 3.54 L
FEV6-Pre: 3.48 L
FEV6FVC-%Pred-Post: 103 %
FEV6FVC-%Pred-Pre: 103 %
FVC-%Change-Post: -2 %
FVC-%Pred-Post: 67 %
FVC-%Pred-Pre: 69 %
FVC-Post: 3.55 L
FVC-Pre: 3.64 L
POST FEV1/FVC RATIO: 89 %
POST FEV6/FVC RATIO: 100 %
PRE FEV1/FVC RATIO: 83 %
Pre FEV6/FVC Ratio: 100 %
RV % pred: 90 %
RV: 1.69 L
TLC % PRED: 77 %
TLC: 5.37 L

## 2015-01-30 LAB — HYPERSENSITIVITY PNUEMONITIS PROFILE

## 2015-01-30 MED ORDER — ALBUTEROL SULFATE (2.5 MG/3ML) 0.083% IN NEBU
2.5000 mg | INHALATION_SOLUTION | Freq: Once | RESPIRATORY_TRACT | Status: AC
  Administered 2015-01-30: 2.5 mg via RESPIRATORY_TRACT

## 2015-01-30 NOTE — Telephone Encounter (Signed)
Called and spoke to pt. Appt made with MR on 02/01/15 at 0945. Pt verbalized understanding and denied any further questions or concerns at this time.

## 2015-02-01 ENCOUNTER — Encounter: Payer: Self-pay | Admitting: Internal Medicine

## 2015-02-01 ENCOUNTER — Ambulatory Visit (INDEPENDENT_AMBULATORY_CARE_PROVIDER_SITE_OTHER): Payer: BLUE CROSS/BLUE SHIELD | Admitting: Internal Medicine

## 2015-02-01 VITALS — BP 138/88 | HR 82 | Ht 69.0 in | Wt 197.0 lb

## 2015-02-01 DIAGNOSIS — F17210 Nicotine dependence, cigarettes, uncomplicated: Secondary | ICD-10-CM

## 2015-02-01 DIAGNOSIS — J849 Interstitial pulmonary disease, unspecified: Secondary | ICD-10-CM | POA: Diagnosis not present

## 2015-02-01 DIAGNOSIS — R748 Abnormal levels of other serum enzymes: Secondary | ICD-10-CM | POA: Diagnosis not present

## 2015-02-01 DIAGNOSIS — Z720 Tobacco use: Secondary | ICD-10-CM

## 2015-02-01 NOTE — Progress Notes (Signed)
Subjective:    Patient ID: Kevin Ferguson, male    DOB: 05-Jan-1971, 44 y.o.   MRN: [id number]  HPI    OV 02/01/2015  Chief Complaint  Patient presents with  . Follow-up    Pt here after PFT, HRCT, and labs. Pt denies change in breathing. Pt c/o mild non prod cough, chest congestion, chest tightness with and without activity.     Fu unexplained dyspnea. Here to follow on results. Presents with wife  Dyspnea persists PFT - restriction with low DLCO - walk test in office  - no desats HRCT - read by Dr Weber Cooks - RB-ILD +++  HP panel - negativ Autooimmune   - CK 300s  - ACE 70s   - rest negative  Reports that he quit smoking for 4 years and resumed earlier this year and feels smoking and dyspnea unrelated because even when he was quit from smoking dyspnea present   Current outpatient prescriptions:  .  ALPRAZolam (XANAX) 0.25 MG tablet, Take 0.25 mg by mouth as needed., Disp: , Rfl:  .  atorvastatin (LIPITOR) 20 MG tablet, Take 20 mg by mouth daily., Disp: , Rfl:  .  esomeprazole (NEXIUM) 40 MG capsule, 2 (two) times daily., Disp: , Rfl:  .  famotidine (PEPCID) 20 MG tablet, One at bedtime, Disp: , Rfl:  .  HYDROcodone-acetaminophen (NORCO/VICODIN) 5-325 MG per tablet, Take 1 tablet by mouth as needed., Disp: , Rfl:  .  ibuprofen (ADVIL,MOTRIN) 800 MG tablet, Take 800 mg by mouth as needed., Disp: , Rfl:  .  levalbuterol (XOPENEX HFA) 45 MCG/ACT inhaler, Inhale 90 mcg into the lungs. 5-6 times daily, Disp: , Rfl:  .  meclizine (ANTIVERT) 25 MG tablet, daily., Disp: , Rfl:  .  mometasone-formoterol (DULERA) 100-5 MCG/ACT AERO, Take 2 puffs first thing in am and then another 2 puffs about 12 hours later., Disp: 1 Inhaler, Rfl: 11 .  montelukast (SINGULAIR) 10 MG tablet, Take 10 mg by mouth at bedtime., Disp: , Rfl:  .  nitroGLYCERIN (NITROSTAT) 0.3 MG SL tablet, Place 0.3 mg under the tongue as needed., Disp: , Rfl:  .  NON FORMULARY, CPAP at bedtime, Disp: , Rfl:  .   PARoxetine (PAXIL) 40 MG tablet, Take 60 mg by mouth daily., Disp: , Rfl:  .  pregabalin (LYRICA) 150 MG capsule, 2 (two) times daily., Disp: , Rfl:  .  rOPINIRole (REQUIP) 2 MG tablet, daily., Disp: , Rfl:  .  topiramate (TOPAMAX) 50 MG tablet, Take 50 mg by mouth 2 (two) times daily., Disp: , Rfl:  .  testosterone cypionate (DEPOTESTOSTERONE CYPIONATE) 200 MG/ML injection, Every month, Disp: , Rfl:     reports that he has been smoking Cigarettes.  He has a 12.5 pack-year smoking history. He does not have any smokeless tobacco history on file.    Review of Systems  Constitutional: Negative for fever and unexpected weight change.  HENT: Negative for congestion, dental problem, ear pain, nosebleeds, postnasal drip, rhinorrhea, sinus pressure, sneezing, sore throat and trouble swallowing.   Eyes: Negative for redness and itching.  Respiratory: Positive for cough, chest tightness and shortness of breath. Negative for wheezing.   Cardiovascular: Negative for palpitations and leg swelling.  Gastrointestinal: Negative for nausea and vomiting.  Genitourinary: Negative for dysuria.  Musculoskeletal: Negative for joint swelling.  Skin: Negative for rash.  Neurological: Negative for headaches.  Hematological: Does not bruise/bleed easily.  Psychiatric/Behavioral: Negative for dysphoric mood. The patient is not nervous/anxious.  Objective:   Physical Exam   Filed Vitals:   02/01/15 0952  BP: 138/88  Pulse: 82  Height: 5\' 9"  (1.753 m)  Weight: 197 lb (89.359 kg)  SpO2: 98%   Discussion only visit     Assessment & Plan:     ICD-9-CM ICD-10-CM   1. ILD (interstitial lung disease) 515 J84.9 CT Chest High Resolution     CANCELED: CT Chest High Resolution  2. Cigarette smoker 305.1 Z72.0   3. Elevated creatine kinase 790.5 R74.8    #ILD with smoking   - can explain his dyspnea atleast in part. Not clear why he was dyspneic when he quit smokning if this was all RB-ILD. High  ACE suggests sarcoid but CT not typical. Explained ILD and all this to him and wife. Will have him quit smoking for 3 months and repeat HRCT. If infiltrates still present then will rec surgical lung bx. He is willing to try this. NOTED : he is on topamax for migraine but bicarb normal and no evidence of acidosis to explain dyspnea  #Elevated CK  - he feels achy al the time. He is on lipitor  - plan  - lipitor holiday for 1 month and repeat CK   > 50% of this > 25 min visit spent in face to face counseling or coordination of care    Dr. Brand Males, M.D., [facility].C.P Pulmonary and Critical Care Medicine Staff Physician [facility] Pager: [phone], If no answer or between  15:00h - 7:00h: call [phone]  02/01/2015 4:35 PM

## 2015-02-01 NOTE — Patient Instructions (Addendum)
ICD-9-CM ICD-10-CM   1. ILD (interstitial lung disease) 515 J84.9   2. Cigarette smoker 305.1 Z72.0   3. Elevated creatine kinase 790.5 R74.8     You appear to have interstitial lung disease CT chest pattern suggests that this is due to smoking  - A disease called RB-ILD  He also have slightly elevated creatinine kinase  - Likely related to Lipitor  Plan  - Stop Lipitor  - Do repeat creatinine kinase level in 1 month   - Quit smoking for 3 months  - Repeat high-resolution CT scan of the chest in 3 months ILD protocol   -

## 2015-02-07 ENCOUNTER — Ambulatory Visit (INDEPENDENT_AMBULATORY_CARE_PROVIDER_SITE_OTHER): Payer: BLUE CROSS/BLUE SHIELD | Admitting: Pulmonary Disease

## 2015-02-07 ENCOUNTER — Encounter: Payer: Self-pay | Admitting: Pulmonary Disease

## 2015-02-07 VITALS — BP 116/72 | HR 87 | Ht 70.0 in | Wt 195.0 lb

## 2015-02-07 DIAGNOSIS — G4733 Obstructive sleep apnea (adult) (pediatric): Secondary | ICD-10-CM | POA: Diagnosis not present

## 2015-02-07 DIAGNOSIS — Z9989 Dependence on other enabling machines and devices: Principal | ICD-10-CM

## 2015-02-07 NOTE — Progress Notes (Deleted)
   Subjective:    Patient ID: Kevin Ferguson, male    DOB: 1970/07/11, 44 y.o.   MRN: [id number]  HPI    Review of Systems  Constitutional: Positive for unexpected weight change. Negative for fever.  HENT: Positive for trouble swallowing. Negative for congestion, dental problem, ear pain, nosebleeds, postnasal drip, rhinorrhea, sinus pressure, sneezing and sore throat.   Eyes: Negative for redness and itching.  Respiratory: Positive for cough and chest tightness. Negative for shortness of breath and wheezing.   Cardiovascular: Positive for leg swelling. Negative for palpitations.  Gastrointestinal: Negative for nausea and vomiting.  Genitourinary: Negative for dysuria.  Musculoskeletal: Positive for joint swelling.  Skin: Negative for rash.  Neurological: Positive for headaches.  Hematological: Does not bruise/bleed easily.  Psychiatric/Behavioral: Negative for dysphoric mood. The patient is nervous/anxious.        Objective:   Physical Exam        Assessment & Plan:

## 2015-02-07 NOTE — Patient Instructions (Signed)
Will arrange for overnight oxygen test with you using CPAP Call if mouth dryness doesn't get better after using new CPAP mask Will get copy of sleep study from [facility]  Follow up in 6 months
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
## 2015-02-09 ENCOUNTER — Ambulatory Visit: Payer: BLUE CROSS/BLUE SHIELD | Admitting: Internal Medicine

## 2015-02-13 ENCOUNTER — Telehealth: Payer: Self-pay | Admitting: Pulmonary Disease

## 2015-02-13 ENCOUNTER — Encounter: Payer: Self-pay | Admitting: Pulmonary Disease

## 2015-02-13 DIAGNOSIS — Z9989 Dependence on other enabling machines and devices: Secondary | ICD-10-CM

## 2015-02-13 DIAGNOSIS — G4733 Obstructive sleep apnea (adult) (pediatric): Secondary | ICD-10-CM | POA: Insufficient documentation

## 2015-02-13 NOTE — Telephone Encounter (Signed)
ONO with CPAP and RA 02/08/15 >> test time 8 hrs 23 min. Basal SpO2 94.4%, low SpO2 51% (artifact).  Spent 2.8 min with SpO2 < 88%.   Will have my nurse inform pt that ONO looked okay.  He does not need to use supplemental oxygen at night.  He should continue using CPAP at night.

## 2015-02-14 NOTE — Telephone Encounter (Signed)
[phone], pt calling for results

## 2015-02-14 NOTE — Telephone Encounter (Signed)
Spoke with pt and notified of results per Dr. Sood. Pt verbalized understanding and denied any questions.  

## 2015-02-20 ENCOUNTER — Ambulatory Visit: Payer: BLUE CROSS/BLUE SHIELD | Admitting: Internal Medicine

## 2015-03-07 ENCOUNTER — Other Ambulatory Visit: Payer: Self-pay | Admitting: Internal Medicine

## 2015-03-07 ENCOUNTER — Other Ambulatory Visit: Payer: BLUE CROSS/BLUE SHIELD

## 2015-03-07 DIAGNOSIS — R748 Abnormal levels of other serum enzymes: Secondary | ICD-10-CM

## 2015-03-08 LAB — CK TOTAL AND CKMB (NOT AT ARMC)
CK TOTAL: 137 U/L (ref 7–232)
CK, MB: 2.5 ng/mL (ref 0.0–5.0)
RELATIVE INDEX: 1.8 (ref 0.0–4.0)

## 2015-03-12 ENCOUNTER — Encounter: Payer: Self-pay | Admitting: Pulmonary Disease

## 2015-03-13 ENCOUNTER — Encounter (HOSPITAL_COMMUNITY): Payer: Self-pay | Admitting: Emergency Medicine

## 2015-03-13 ENCOUNTER — Inpatient Hospital Stay (HOSPITAL_COMMUNITY): Payer: BLUE CROSS/BLUE SHIELD

## 2015-03-13 ENCOUNTER — Emergency Department (HOSPITAL_COMMUNITY): Payer: BLUE CROSS/BLUE SHIELD

## 2015-03-13 ENCOUNTER — Inpatient Hospital Stay (HOSPITAL_COMMUNITY)
Admission: EM | Admit: 2015-03-13 | Discharge: 2015-03-17 | DRG: 389 | Disposition: A | Discharge status: Home / Self Care | Payer: BLUE CROSS/BLUE SHIELD | Attending: Oncology | Admitting: Oncology

## 2015-03-13 DIAGNOSIS — Z825 Family history of asthma and other chronic lower respiratory diseases: Secondary | ICD-10-CM | POA: Diagnosis not present

## 2015-03-13 DIAGNOSIS — Y848 Other medical procedures as the cause of abnormal reaction of the patient, or of later complication, without mention of misadventure at the time of the procedure: Secondary | ICD-10-CM | POA: Diagnosis not present

## 2015-03-13 DIAGNOSIS — G2581 Restless legs syndrome: Secondary | ICD-10-CM | POA: Diagnosis present

## 2015-03-13 DIAGNOSIS — K56600 Partial intestinal obstruction, unspecified as to cause: Secondary | ICD-10-CM | POA: Diagnosis present

## 2015-03-13 DIAGNOSIS — R51 Headache: Secondary | ICD-10-CM | POA: Diagnosis present

## 2015-03-13 DIAGNOSIS — R509 Fever, unspecified: Secondary | ICD-10-CM | POA: Diagnosis not present

## 2015-03-13 DIAGNOSIS — F329 Major depressive disorder, single episode, unspecified: Secondary | ICD-10-CM

## 2015-03-13 DIAGNOSIS — L03114 Cellulitis of left upper limb: Secondary | ICD-10-CM | POA: Diagnosis present

## 2015-03-13 DIAGNOSIS — J439 Emphysema, unspecified: Secondary | ICD-10-CM | POA: Diagnosis present

## 2015-03-13 DIAGNOSIS — T880XXA Infection following immunization, initial encounter: Secondary | ICD-10-CM | POA: Diagnosis not present

## 2015-03-13 DIAGNOSIS — Z23 Encounter for immunization: Secondary | ICD-10-CM | POA: Diagnosis not present

## 2015-03-13 DIAGNOSIS — F419 Anxiety disorder, unspecified: Secondary | ICD-10-CM | POA: Diagnosis present

## 2015-03-13 DIAGNOSIS — Z833 Family history of diabetes mellitus: Secondary | ICD-10-CM

## 2015-03-13 DIAGNOSIS — Z79899 Other long term (current) drug therapy: Secondary | ICD-10-CM

## 2015-03-13 DIAGNOSIS — D72829 Elevated white blood cell count, unspecified: Secondary | ICD-10-CM | POA: Diagnosis not present

## 2015-03-13 DIAGNOSIS — J45909 Unspecified asthma, uncomplicated: Secondary | ICD-10-CM | POA: Diagnosis present

## 2015-03-13 DIAGNOSIS — J449 Chronic obstructive pulmonary disease, unspecified: Secondary | ICD-10-CM | POA: Diagnosis present

## 2015-03-13 DIAGNOSIS — E7800 Pure hypercholesterolemia, unspecified: Secondary | ICD-10-CM | POA: Diagnosis present

## 2015-03-13 DIAGNOSIS — K5669 Other intestinal obstruction: Secondary | ICD-10-CM

## 2015-03-13 DIAGNOSIS — E785 Hyperlipidemia, unspecified: Secondary | ICD-10-CM

## 2015-03-13 DIAGNOSIS — M797 Fibromyalgia: Secondary | ICD-10-CM | POA: Diagnosis present

## 2015-03-13 DIAGNOSIS — K219 Gastro-esophageal reflux disease without esophagitis: Secondary | ICD-10-CM | POA: Diagnosis present

## 2015-03-13 DIAGNOSIS — G4733 Obstructive sleep apnea (adult) (pediatric): Secondary | ICD-10-CM | POA: Diagnosis present

## 2015-03-13 DIAGNOSIS — Z7951 Long term (current) use of inhaled steroids: Secondary | ICD-10-CM

## 2015-03-13 DIAGNOSIS — F17210 Nicotine dependence, cigarettes, uncomplicated: Secondary | ICD-10-CM

## 2015-03-13 DIAGNOSIS — Z8349 Family history of other endocrine, nutritional and metabolic diseases: Secondary | ICD-10-CM

## 2015-03-13 DIAGNOSIS — E876 Hypokalemia: Secondary | ICD-10-CM | POA: Diagnosis present

## 2015-03-13 DIAGNOSIS — K565 Intestinal adhesions [bands] with obstruction (postprocedural) (postinfection): Secondary | ICD-10-CM | POA: Diagnosis present

## 2015-03-13 DIAGNOSIS — K56609 Unspecified intestinal obstruction, unspecified as to partial versus complete obstruction: Secondary | ICD-10-CM | POA: Diagnosis present

## 2015-03-13 DIAGNOSIS — Z8249 Family history of ischemic heart disease and other diseases of the circulatory system: Secondary | ICD-10-CM

## 2015-03-13 DIAGNOSIS — Y92230 Patient room in hospital as the place of occurrence of the external cause: Secondary | ICD-10-CM

## 2015-03-13 DIAGNOSIS — Z9989 Dependence on other enabling machines and devices: Secondary | ICD-10-CM

## 2015-03-13 DIAGNOSIS — B9689 Other specified bacterial agents as the cause of diseases classified elsewhere: Secondary | ICD-10-CM | POA: Diagnosis not present

## 2015-03-13 HISTORY — DX: Headache: R51

## 2015-03-13 HISTORY — DX: Gastro-esophageal reflux disease without esophagitis: K21.9

## 2015-03-13 HISTORY — DX: Depression, unspecified: F32.A

## 2015-03-13 HISTORY — DX: Headache, unspecified: R51.9

## 2015-03-13 HISTORY — DX: Major depressive disorder, single episode, unspecified: F32.9

## 2015-03-13 HISTORY — DX: Prediabetes: R73.03

## 2015-03-13 HISTORY — DX: Dependence on other enabling machines and devices: Z99.89

## 2015-03-13 HISTORY — DX: Obstructive sleep apnea (adult) (pediatric): G47.33

## 2015-03-13 LAB — URINALYSIS, ROUTINE W REFLEX MICROSCOPIC
BILIRUBIN URINE: NEGATIVE
Glucose, UA: NEGATIVE mg/dL
HGB URINE DIPSTICK: NEGATIVE
KETONES UR: NEGATIVE mg/dL
Leukocytes, UA: NEGATIVE
NITRITE: NEGATIVE
Protein, ur: NEGATIVE mg/dL
Specific Gravity, Urine: 1.03 (ref 1.005–1.030)
UROBILINOGEN UA: 1 mg/dL (ref 0.0–1.0)
pH: 8.5 — ABNORMAL HIGH (ref 5.0–8.0)

## 2015-03-13 LAB — COMPREHENSIVE METABOLIC PANEL
ALBUMIN: 3.6 g/dL (ref 3.5–5.0)
ALK PHOS: 100 U/L (ref 38–126)
ALT: 29 U/L (ref 17–63)
ANION GAP: 7 (ref 5–15)
AST: 25 U/L (ref 15–41)
BILIRUBIN TOTAL: 0.7 mg/dL (ref 0.3–1.2)
BUN: 12 mg/dL (ref 6–20)
CALCIUM: 9 mg/dL (ref 8.9–10.3)
CO2: 25 mmol/L (ref 22–32)
Chloride: 106 mmol/L (ref 101–111)
Creatinine, Ser: 0.91 mg/dL (ref 0.61–1.24)
Glucose, Bld: 87 mg/dL (ref 65–99)
POTASSIUM: 3.4 mmol/L — AB (ref 3.5–5.1)
Sodium: 138 mmol/L (ref 135–145)
TOTAL PROTEIN: 6.2 g/dL — AB (ref 6.5–8.1)

## 2015-03-13 LAB — CBC
HEMATOCRIT: 42.8 % (ref 39.0–52.0)
Hemoglobin: 14.8 g/dL (ref 13.0–17.0)
MCH: 33 pg (ref 26.0–34.0)
MCHC: 34.6 g/dL (ref 30.0–36.0)
MCV: 95.3 fL (ref 78.0–100.0)
PLATELETS: 293 10*3/uL (ref 150–400)
RBC: 4.49 MIL/uL (ref 4.22–5.81)
RDW: 13.3 % (ref 11.5–15.5)
WBC: 7.9 10*3/uL (ref 4.0–10.5)

## 2015-03-13 LAB — LIPASE, BLOOD: LIPASE: 16 U/L — AB (ref 22–51)

## 2015-03-13 LAB — MAGNESIUM: MAGNESIUM: 2 mg/dL (ref 1.7–2.4)

## 2015-03-13 MED ORDER — POTASSIUM CHLORIDE 10 MEQ/100ML IV SOLN
10.0000 meq | INTRAVENOUS | Status: DC
  Administered 2015-03-13 (×2): 10 meq via INTRAVENOUS
  Filled 2015-03-13 (×2): qty 100

## 2015-03-13 MED ORDER — DIATRIZOATE MEGLUMINE & SODIUM 66-10 % PO SOLN
ORAL | Status: AC
  Filled 2015-03-13: qty 90

## 2015-03-13 MED ORDER — DEXTROSE-NACL 5-0.45 % IV SOLN
INTRAVENOUS | Status: DC
Start: 2015-03-13 — End: 2015-03-16
  Administered 2015-03-13 – 2015-03-16 (×8): via INTRAVENOUS

## 2015-03-13 MED ORDER — HYDROMORPHONE HCL 1 MG/ML IJ SOLN
1.0000 mg | Freq: Once | INTRAMUSCULAR | Status: AC
  Administered 2015-03-13: 1 mg via INTRAVENOUS
  Filled 2015-03-13: qty 1

## 2015-03-13 MED ORDER — INFLUENZA VAC SPLIT QUAD 0.5 ML IM SUSY
0.5000 mL | PREFILLED_SYRINGE | INTRAMUSCULAR | Status: AC
Start: 2015-03-14 — End: 2015-03-14
  Administered 2015-03-14: 0.5 mL via INTRAMUSCULAR
  Filled 2015-03-13: qty 0.5

## 2015-03-13 MED ORDER — PANTOPRAZOLE SODIUM 40 MG IV SOLR
40.0000 mg | INTRAVENOUS | Status: DC
  Administered 2015-03-13 – 2015-03-14 (×2): 40 mg via INTRAVENOUS
  Filled 2015-03-13 (×2): qty 40

## 2015-03-13 MED ORDER — IOHEXOL 300 MG/ML  SOLN: 50.0000 mL | Freq: Once | INTRAMUSCULAR | Status: DC | PRN

## 2015-03-13 MED ORDER — ALBUTEROL SULFATE (2.5 MG/3ML) 0.083% IN NEBU: 2.5000 mg | INHALATION_SOLUTION | Freq: Four times a day (QID) | RESPIRATORY_TRACT | Status: DC | PRN

## 2015-03-13 MED ORDER — MOMETASONE FURO-FORMOTEROL FUM 100-5 MCG/ACT IN AERO
2.0000 | INHALATION_SPRAY | Freq: Two times a day (BID) | RESPIRATORY_TRACT | Status: DC
  Administered 2015-03-13 – 2015-03-17 (×7): 2 via RESPIRATORY_TRACT
  Filled 2015-03-13: qty 8.8

## 2015-03-13 MED ORDER — ONDANSETRON HCL 4 MG/2ML IJ SOLN
4.0000 mg | Freq: Once | INTRAMUSCULAR | Status: AC
  Administered 2015-03-13: 4 mg via INTRAVENOUS
  Filled 2015-03-13: qty 2

## 2015-03-13 MED ORDER — IOHEXOL 300 MG/ML  SOLN
25.0000 mL | INTRAMUSCULAR | Status: AC
  Administered 2015-03-13: 25 mL via ORAL

## 2015-03-13 MED ORDER — IOHEXOL 300 MG/ML  SOLN
100.0000 mL | Freq: Once | INTRAMUSCULAR | Status: AC | PRN
  Administered 2015-03-13: 100 mL via INTRAVENOUS

## 2015-03-13 MED ORDER — PNEUMOCOCCAL VAC POLYVALENT 25 MCG/0.5ML IJ INJ
0.5000 mL | INJECTION | INTRAMUSCULAR | Status: AC
  Administered 2015-03-14: 0.5 mL via INTRAMUSCULAR
  Filled 2015-03-13: qty 0.5

## 2015-03-13 MED ORDER — MIDAZOLAM HCL 2 MG/2ML IJ SOLN
2.0000 mg | Freq: Once | INTRAMUSCULAR | Status: AC
  Administered 2015-03-13: 2 mg via INTRAVENOUS
  Filled 2015-03-13: qty 2

## 2015-03-13 MED ORDER — POTASSIUM CHLORIDE 10 MEQ/100ML IV SOLN
10.0000 meq | INTRAVENOUS | Status: AC
  Administered 2015-03-13 (×2): 10 meq via INTRAVENOUS
  Filled 2015-03-13 (×2): qty 100

## 2015-03-13 MED ORDER — DIATRIZOATE MEGLUMINE & SODIUM 66-10 % PO SOLN
90.0000 mL | Freq: Once | ORAL | Status: AC
  Administered 2015-03-13: 90 mL via NASOGASTRIC

## 2015-03-13 MED ORDER — MORPHINE SULFATE (PF) 2 MG/ML IV SOLN
1.0000 mg | INTRAVENOUS | Status: DC | PRN
  Administered 2015-03-13 – 2015-03-14 (×6): 1 mg via INTRAVENOUS
  Filled 2015-03-13 (×6): qty 1

## 2015-03-13 MED ORDER — SODIUM CHLORIDE 0.9 % IV BOLUS (SEPSIS)
1000.0000 mL | Freq: Once | INTRAVENOUS | Status: AC
  Administered 2015-03-13: 1000 mL via INTRAVENOUS

## 2015-03-13 MED ORDER — HYDROMORPHONE HCL 1 MG/ML IJ SOLN
1.0000 mg | Freq: Once | INTRAMUSCULAR | Status: AC
Start: 2015-03-13 — End: 2015-03-13
  Administered 2015-03-13: 1 mg via INTRAVENOUS
  Filled 2015-03-13: qty 1

## 2015-03-13 MED ORDER — IOHEXOL 300 MG/ML  SOLN: 25.0000 mL | INTRAMUSCULAR | Status: DC

## 2015-03-13 MED ORDER — ENOXAPARIN SODIUM 40 MG/0.4ML ~~LOC~~ SOLN
40.0000 mg | SUBCUTANEOUS | Status: DC
  Administered 2015-03-13 – 2015-03-16 (×4): 40 mg via SUBCUTANEOUS
  Filled 2015-03-13 (×4): qty 0.4
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
## 2015-03-13 NOTE — ED Notes (Signed)
Pt sts mid to lower abd pain with diarrhea; pt sts hx of SBO and feels similar

## 2015-03-13 NOTE — ED Notes (Signed)
Notified CT that pt has completed oral contrast
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
## 2015-03-14 DIAGNOSIS — E876 Hypokalemia: Secondary | ICD-10-CM | POA: Diagnosis present

## 2015-03-14 DIAGNOSIS — K219 Gastro-esophageal reflux disease without esophagitis: Secondary | ICD-10-CM | POA: Diagnosis present

## 2015-03-14 LAB — CBC WITH DIFFERENTIAL/PLATELET
BASOS ABS: 0 10*3/uL (ref 0.0–0.1)
BASOS PCT: 0 %
Eosinophils Absolute: 0.1 10*3/uL (ref 0.0–0.7)
Eosinophils Relative: 2 %
HEMATOCRIT: 39.5 % (ref 39.0–52.0)
Hemoglobin: 13.3 g/dL (ref 13.0–17.0)
Lymphocytes Relative: 24 %
Lymphs Abs: 2 10*3/uL (ref 0.7–4.0)
MCH: 32.4 pg (ref 26.0–34.0)
MCHC: 33.7 g/dL (ref 30.0–36.0)
MCV: 96.1 fL (ref 78.0–100.0)
MONO ABS: 0.9 10*3/uL (ref 0.1–1.0)
Monocytes Relative: 11 %
NEUTROS ABS: 5.2 10*3/uL (ref 1.7–7.7)
Neutrophils Relative %: 63 %
PLATELETS: 287 10*3/uL (ref 150–400)
RBC: 4.11 MIL/uL — ABNORMAL LOW (ref 4.22–5.81)
RDW: 13.4 % (ref 11.5–15.5)
WBC: 8.3 10*3/uL (ref 4.0–10.5)

## 2015-03-14 LAB — COMPREHENSIVE METABOLIC PANEL
ALK PHOS: 89 U/L (ref 38–126)
ALT: 24 U/L (ref 17–63)
ANION GAP: 8 (ref 5–15)
AST: 19 U/L (ref 15–41)
Albumin: 3.3 g/dL — ABNORMAL LOW (ref 3.5–5.0)
BILIRUBIN TOTAL: 0.8 mg/dL (ref 0.3–1.2)
BUN: 9 mg/dL (ref 6–20)
CALCIUM: 8.7 mg/dL — AB (ref 8.9–10.3)
CO2: 26 mmol/L (ref 22–32)
Chloride: 104 mmol/L (ref 101–111)
Creatinine, Ser: 1.17 mg/dL (ref 0.61–1.24)
GLUCOSE: 111 mg/dL — AB (ref 65–99)
Potassium: 3.3 mmol/L — ABNORMAL LOW (ref 3.5–5.1)
Sodium: 138 mmol/L (ref 135–145)
TOTAL PROTEIN: 5.9 g/dL — AB (ref 6.5–8.1)

## 2015-03-14 LAB — HEMOGLOBIN A1C
Hgb A1c MFr Bld: 5.9 % — ABNORMAL HIGH (ref 4.8–5.6)
MEAN PLASMA GLUCOSE: 123 mg/dL

## 2015-03-14 MED ORDER — MORPHINE SULFATE (PF) 2 MG/ML IV SOLN
2.0000 mg | INTRAVENOUS | Status: DC | PRN
  Administered 2015-03-14 – 2015-03-15 (×6): 2 mg via INTRAVENOUS
  Filled 2015-03-14 (×6): qty 1

## 2015-03-14 MED ORDER — POTASSIUM CHLORIDE 10 MEQ/100ML IV SOLN
10.0000 meq | INTRAVENOUS | Status: AC
  Administered 2015-03-14 (×4): 10 meq via INTRAVENOUS
  Filled 2015-03-14 (×4): qty 100

## 2015-03-14 NOTE — Progress Notes (Signed)
Patient ID: Kevin Ferguson, male   DOB: May 30, 1971, 44 y.o.   MRN: [id number]     [facility]      [address] [place], [place] [address]    Phone: [phone] FAX: [phone]     Subjective: Still c/o pain.  Started having watery diarrhea again.  Afebrile.  Normal white count.  AXR with contrast in the colon.   Objective:  Vital signs:  Filed Vitals:   03/13/15 1730 03/13/15 1837 03/13/15 2107 03/14/15 0430  BP: 123/71 128/76 124/80 109/73  Pulse: 61 66 81 71  Temp:  98.3 F (36.8 C) 98.6 F (37 C) 98.2 F (36.8 C)  TempSrc:  Oral Oral Oral  Resp:  _0 Height:  _1  (1.778 m)    Weight:  87.454 kg (192 lb 12.8 oz)    SpO2: 93% 94% 97% 96%    Last BM Date: 03/12/15  Intake/Output   Yesterday:  09/27 0701 - 09/28 0700 In: 1700 [I.V.:1700] Out: 351 [Urine:1; Emesis/NG output:350] This shift: I/O last 3 completed shifts: In: 1700 [I.V.:1700] Out: 351 [Urine:1; Emesis/NG output:350]    Physical Exam: General: Pt awake/alert/oriented x4 in no  acute distress  Abdomen: Soft.  Nondistended. Non tender(when distracted).  No hernias.  No peritonitis.  Previous midline incisions.    Problem List:   Principal Problem:   Partial small bowel obstruction Active Problems:   OSA on CPAP   COPD (chronic obstructive pulmonary disease)   Anxiety   Fibromyalgia    Results:   Labs: Results for orders placed or performed during the hospital encounter of 03/13/15 (from the past 48 hour(s))  CBC     Status: None   Collection Time: 03/13/15 10:09 AM  Result Value Ref Range   WBC 7.9 4.0 - 10.5 K/uL   RBC 4.49 4.22 - 5.81 MIL/uL   Hemoglobin 14.8 13.0 - 17.0 g/dL   HCT 42.8 39.0 - 52.0 %   MCV 95.3 78.0 - 100.0 fL   MCH 33.0 26.0 - 34.0 pg   MCHC 34.6 30.0 - 36.0 g/dL   RDW 13.3 11.5 - 15.5 %   Platelets 293 150 - 400 K/uL  Comprehensive metabolic panel     Status: Abnormal   Collection Time: 03/13/15  10:09 AM  Result Value Ref Range   Sodium 138 135 - 145 mmol/L   Potassium 3.4 (L) 3.5 - 5.1 mmol/L   Chloride 106 101 - 111 mmol/L   CO2 25 22 - 32 mmol/L   Glucose, Bld 87 65 - 99 mg/dL   BUN 12 6 - 20 mg/dL   Creatinine, Ser 0.91 0.61 - 1.24 mg/dL   Calcium 9.0 8.9 - 10.3 mg/dL   Total Protein 6.2 (L) 6.5 - 8.1 g/dL   Albumin 3.6 3.5 - 5.0 g/dL   AST 25 15 - 41 U/L   ALT 29 17 - 63 U/L   Alkaline Phosphatase 100 38 - 126 U/L   Total Bilirubin 0.7 0.3 - 1.2 mg/dL   GFR calc non Af Amer >60 >60 mL/min   GFR calc Af Amer >60 >60 mL/min    Comment: (NOTE) The eGFR has been calculated using the CKD EPI equation. This calculation has not been validated in all clinical situations. eGFR's persistently <60 mL/min signify possible Chronic Kidney Disease.    Anion gap 7 5 - 15  Lipase, blood     Status: Abnormal   Collection Time: 03/13/15  10:09 AM  Result Value Ref Range   Lipase 16 (L) 22 - 51 U/L  Hemoglobin A1c     Status: Abnormal   Collection Time: 03/13/15 10:09 AM  Result Value Ref Range   Hgb A1c MFr Bld 5.9 (H) 4.8 - 5.6 %    Comment: (NOTE)         Pre-diabetes: 5.7 - 6.4         Diabetes: >6.4         Glycemic control for adults with diabetes: <7.0    Mean Plasma Glucose 123 mg/dL    Comment: (NOTE) Performed At: [facility] [place], [place] [id number] Lindon Romp MD [id number]   Urinalysis, Routine w reflex microscopic (not at [facility])     Status: Abnormal   Collection Time: 03/13/15 11:21 AM  Result Value Ref Range   Color, Urine YELLOW YELLOW   APPearance CLEAR CLEAR   Specific Gravity, Urine 1.030 1.005 - 1.030   pH 8.5 (H) 5.0 - 8.0   Glucose, UA NEGATIVE NEGATIVE mg/dL   Hgb urine dipstick NEGATIVE NEGATIVE   Bilirubin Urine NEGATIVE NEGATIVE   Ketones, ur NEGATIVE NEGATIVE mg/dL   Protein, ur NEGATIVE NEGATIVE mg/dL   Urobilinogen, UA 1.0 0.0 - 1.0 mg/dL   Nitrite NEGATIVE NEGATIVE   Leukocytes, UA NEGATIVE NEGATIVE     Comment: MICROSCOPIC NOT DONE ON URINES WITH NEGATIVE PROTEIN, BLOOD, LEUKOCYTES, NITRITE, OR GLUCOSE <1000 mg/dL.  Magnesium     Status: None   Collection Time: 03/13/15  7:12 PM  Result Value Ref Range   Magnesium 2.0 1.7 - 2.4 mg/dL  Comprehensive metabolic panel     Status: Abnormal   Collection Time: 03/14/15  5:05 AM  Result Value Ref Range   Sodium 138 135 - 145 mmol/L   Potassium 3.3 (L) 3.5 - 5.1 mmol/L   Chloride 104 101 - 111 mmol/L   CO2 26 22 - 32 mmol/L   Glucose, Bld 111 (H) 65 - 99 mg/dL   BUN 9 6 - 20 mg/dL   Creatinine, Ser 1.17 0.61 - 1.24 mg/dL   Calcium 8.7 (L) 8.9 - 10.3 mg/dL   Total Protein 5.9 (L) 6.5 - 8.1 g/dL   Albumin 3.3 (L) 3.5 - 5.0 g/dL   AST 19 15 - 41 U/L   ALT 24 17 - 63 U/L   Alkaline Phosphatase 89 38 - 126 U/L   Total Bilirubin 0.8 0.3 - 1.2 mg/dL   GFR calc non Af Amer >60 >60 mL/min   GFR calc Af Amer >60 >60 mL/min    Comment: (NOTE) The eGFR has been calculated using the CKD EPI equation. This calculation has not been validated in all clinical situations. eGFR's persistently <60 mL/min signify possible Chronic Kidney Disease.    Anion gap 8 5 - 15  CBC WITH DIFFERENTIAL     Status: Abnormal   Collection Time: 03/14/15  5:05 AM  Result Value Ref Range   WBC 8.3 4.0 - 10.5 K/uL   RBC 4.11 (L) 4.22 - 5.81 MIL/uL   Hemoglobin 13.3 13.0 - 17.0 g/dL   HCT 39.5 39.0 - 52.0 %   MCV 96.1 78.0 - 100.0 fL   MCH 32.4 26.0 - 34.0 pg   MCHC 33.7 30.0 - 36.0 g/dL   RDW 13.4 11.5 - 15.5 %   Platelets 287 150 - 400 K/uL   Neutrophils Relative % 63 %   Neutro Abs 5.2 1.7 - 7.7 K/uL  Lymphocytes Relative 24 %   Lymphs Abs 2.0 0.7 - 4.0 K/uL   Monocytes Relative 11 %   Monocytes Absolute 0.9 0.1 - 1.0 K/uL   Eosinophils Relative 2 %   Eosinophils Absolute 0.1 0.0 - 0.7 K/uL   Basophils Relative 0 %   Basophils Absolute 0.0 0.0 - 0.1 K/uL    Imaging / Studies: Ct Abdomen Pelvis W Contrast  03/13/2015   CLINICAL DATA:  44 year old with  low abdominal pain for 4 days. History of small bowel obstruction with surgery in 2014. Initial encounter.  EXAM: CT ABDOMEN AND PELVIS WITH CONTRAST  TECHNIQUE: Multidetector CT imaging of the abdomen and pelvis was performed using the standard protocol following bolus administration of intravenous contrast.  CONTRAST:  166m OMNIPAQUE IOHEXOL 300 MG/ML  SOLN  COMPARISON:  Abdominal pelvic CT 02/10/2013.  FINDINGS: Lower chest: Mild dependent atelectasis at both lung bases. No significant pleural or pericardial effusion. There is a small hiatal hernia.  Hepatobiliary: The liver is normal in density without focal abnormality. No evidence of gallstones, gallbladder wall thickening or biliary dilatation.  Pancreas: Unremarkable. No pancreatic ductal dilatation or surrounding inflammatory changes.  Spleen: Normal in size without focal abnormality.  Adrenals/Urinary Tract: Both adrenal glands appear normal. There is a probable small cyst in the lower interpolar region of the left kidney. The right kidney appears normal. There is no evidence of ureteral calculus or hydronephrosis. There is a linear bladder calculus measuring up to 14 mm in diameter. There is no bladder wall thickening.  Stomach/Bowel: The stomach and proximal small bowel are decompressed. The contrast has passed into the mid small bowel which is mildly dilated. The distal small bowel is decompressed. No focal transition point is identified, although there is possible mild small bowel wall thickening in the right mid abdomen (axial images 54-61). There is also some fecalization of the mid small bowel. The colon appears normal. The appendix is surgically absent. No extraluminal fluid collections or inflammatory changes observed.  Vascular/Lymphatic: There are no enlarged abdominal or pelvic lymph nodes. Mild aortoiliac atherosclerosis noted.  Reproductive: Unremarkable.  Other: There are postsurgical changes within the anterior abdominal wall. No abdominal  wall hernia.  Musculoskeletal: No acute or significant osseous findings. Postsurgical or posttraumatic findings identified anteriorly in the right groin.  IMPRESSION: 1. Findings are suspicious for a recurrent low grade partial mid small bowel obstruction with possible associated mild bowel wall thickening. No high-grade obstruction or perforation identified. Radiographic follow up recommended. 2. Large linear bladder calculus. No ureteral calculus or hydronephrosis.   Electronically Signed   By: WRichardean SaleM.D.   On: 03/13/2015 12:04   Dg Abd Portable 1v-small Bowel Obstruction Protocol-initial, 8 Hr Delay  03/14/2015   CLINICAL DATA:  Small bowel obstruction.  EXAM: PORTABLE ABDOMEN - 1 VIEW  COMPARISON:  CT earlier this day at 1142 hr.  FINDINGS: Initial imaging obtained at 1507 hr demonstrates oral contrast from prior CT throughout the colon. An enteric tube is in place. There is excreted intravenous contrast in the urinary bladder. Enteric contrast was administered which opacifies the stomach.  Subsequent imaging obtained at 2341 hr demonstrates passage of enteric contrast, now visualize throughout the entire colon. No residual gastric or evident small bowel contrast is seen.  IMPRESSION: Oral contrast throughout the entire colon. No residual gastric or small bowel contrast is seen.   Electronically Signed   By: MJeb LeveringM.D.   On: 03/14/2015 00:26    Medications / Allergies:  Scheduled  Meds: . enoxaparin (LOVENOX) injection  40 mg Subcutaneous Q24H  . Influenza vac split quadrivalent PF  0.5 mL Intramuscular Tomorrow-1000  . mometasone-formoterol  2 puff Inhalation BID  . pantoprazole (PROTONIX) IV  40 mg Intravenous Q24H  . pneumococcal 23 valent vaccine  0.5 mL Intramuscular Tomorrow-1000  . potassium chloride  10 mEq Intravenous Q1 Hr x 4   Continuous Infusions: . dextrose 5 % and 0.45% NaCl 125 mL/hr at 03/14/15 0426   PRN Meds:.albuterol, iohexol, morphine  injection  Antibiotics: Anti-infectives    None        Assessment/Plan SBO-resolved, AXR showed contrast throughout the colon.  May continue to have some diarrhea due to contrast.  DC NGT and give clears, advance as tolerated.  Surgery surgery signing off.  Please call for further assistance.   Erby Pian, [facility] Surgery Pager [phone]) For consults and floor pages call [phone]([id number])  03/14/2015 7:55 AM

## 2015-03-14 NOTE — Progress Notes (Signed)
Subjective: Patient with no acute events overnight, still complaining of abdominal pain today.  NG tube discontinued this morning by general surgery.  Diet advanced to clear liquids.  Started having watery diarrhea again this morning.  No nausea or vomiting.  No chest pain or shortness of breath.   Objective: Vital signs in last 24 hours: Filed Vitals:   03/13/15 1730 03/13/15 1837 03/13/15 2107 03/14/15 0430  BP: 123/71 128/76 124/80 109/73  Pulse: 61 66 81 71  Temp:  98.3 F (36.8 C) 98.6 F (37 C) 98.2 F (36.8 C)  TempSrc:  Oral Oral Oral  Resp:  18 17 18   Height:  5\' 10"  (1.778 m)    Weight:  192 lb 12.8 oz (87.454 kg)    SpO2: 93% 94% 97% 96%   Weight change:   Intake/Output Summary (Last 24 hours) at 03/14/15 1413 Last data filed at 03/14/15 0900  Gross per 24 hour  Intake    940 ml  Output    352 ml  Net    588 ml   General: resting in bed, family present HEENT: EOMI, no scleral icterus Cardiac: RRR, no rubs, murmurs or gallops Pulm: clear to auscultation bilaterally, moving normal volumes of air Abd: soft, nondistended, still tender in umbilical area, bowel sounds hypoactive Ext: warm and well perfused, no pedal edema Neuro: alert and oriented X3, cranial nerves II-XII grossly intact  Lab Results: Basic Metabolic Panel:  Recent Labs Lab 03/13/15 1009 03/13/15 1912 03/14/15 0505  NA 138  --  138  K 3.4*  --  3.3*  CL 106  --  104  CO2 25  --  26  GLUCOSE 87  --  111*  BUN 12  --  9  CREATININE 0.91  --  1.17  CALCIUM 9.0  --  8.7*  MG  --  2.0  --    Liver Function Tests:  Recent Labs Lab 03/13/15 1009 03/14/15 0505  AST 25 19  ALT 29 24  ALKPHOS 100 89  BILITOT 0.7 0.8  PROT 6.2* 5.9*  ALBUMIN 3.6 3.3*    Recent Labs Lab 03/13/15 1009  LIPASE 16*   No results for input(s): AMMONIA in the last 168 hours. CBC:  Recent Labs Lab 03/13/15 1009 03/14/15 0505  WBC 7.9 8.3  NEUTROABS  --  5.2  HGB 14.8 13.3  HCT 42.8 39.5  MCV  95.3 96.1  PLT 293 287   Cardiac Enzymes: No results for input(s): CKTOTAL, CKMB, CKMBINDEX, TROPONINI in the last 168 hours. BNP: No results for input(s): PROBNP in the last 168 hours. D-Dimer: No results for input(s): DDIMER in the last 168 hours. CBG: No results for input(s): GLUCAP in the last 168 hours. Hemoglobin A1C:  Recent Labs Lab 03/13/15 1009  HGBA1C 5.9*   Fasting Lipid Panel: No results for input(s): CHOL, HDL, LDLCALC, TRIG, CHOLHDL, LDLDIRECT in the last 168 hours. Thyroid Function Tests: No results for input(s): TSH, T4TOTAL, FREET4, T3FREE, THYROIDAB in the last 168 hours. Coagulation: No results for input(s): LABPROT, INR in the last 168 hours. Anemia Panel: No results for input(s): VITAMINB12, FOLATE, FERRITIN, TIBC, IRON, RETICCTPCT in the last 168 hours. Urine Drug Screen: Drugs of Abuse  No results found for: LABOPIA, COCAINSCRNUR, LABBENZ, AMPHETMU, THCU, LABBARB  Alcohol Level: No results for input(s): ETH in the last 168 hours. Urinalysis:  Recent Labs Lab 03/13/15 1121  COLORURINE YELLOW  LABSPEC 1.030  PHURINE 8.5*  GLUCOSEU NEGATIVE  HGBUR NEGATIVE  BILIRUBINUR NEGATIVE  KETONESUR NEGATIVE  PROTEINUR NEGATIVE  UROBILINOGEN 1.0  NITRITE NEGATIVE  LEUKOCYTESUR NEGATIVE   Micro Results: No results found for this or any previous visit (from the past 240 hour(s)). Studies/Results: Ct Abdomen Pelvis W Contrast  03/13/2015   CLINICAL DATA:  44 year old with low abdominal pain for 4 days. History of small bowel obstruction with surgery in 2014. Initial encounter.  EXAM: CT ABDOMEN AND PELVIS WITH CONTRAST  TECHNIQUE: Multidetector CT imaging of the abdomen and pelvis was performed using the standard protocol following bolus administration of intravenous contrast.  CONTRAST:  125mL OMNIPAQUE IOHEXOL 300 MG/ML  SOLN  COMPARISON:  Abdominal pelvic CT 02/10/2013.  FINDINGS: Lower chest: Mild dependent atelectasis at both lung bases. No significant  pleural or pericardial effusion. There is a small hiatal hernia.  Hepatobiliary: The liver is normal in density without focal abnormality. No evidence of gallstones, gallbladder wall thickening or biliary dilatation.  Pancreas: Unremarkable. No pancreatic ductal dilatation or surrounding inflammatory changes.  Spleen: Normal in size without focal abnormality.  Adrenals/Urinary Tract: Both adrenal glands appear normal. There is a probable small cyst in the lower interpolar region of the left kidney. The right kidney appears normal. There is no evidence of ureteral calculus or hydronephrosis. There is a linear bladder calculus measuring up to 14 mm in diameter. There is no bladder wall thickening.  Stomach/Bowel: The stomach and proximal small bowel are decompressed. The contrast has passed into the mid small bowel which is mildly dilated. The distal small bowel is decompressed. No focal transition point is identified, although there is possible mild small bowel wall thickening in the right mid abdomen (axial images 54-61). There is also some fecalization of the mid small bowel. The colon appears normal. The appendix is surgically absent. No extraluminal fluid collections or inflammatory changes observed.  Vascular/Lymphatic: There are no enlarged abdominal or pelvic lymph nodes. Mild aortoiliac atherosclerosis noted.  Reproductive: Unremarkable.  Other: There are postsurgical changes within the anterior abdominal wall. No abdominal wall hernia.  Musculoskeletal: No acute or significant osseous findings. Postsurgical or posttraumatic findings identified anteriorly in the right groin.  IMPRESSION: 1. Findings are suspicious for a recurrent low grade partial mid small bowel obstruction with possible associated mild bowel wall thickening. No high-grade obstruction or perforation identified. Radiographic follow up recommended. 2. Large linear bladder calculus. No ureteral calculus or hydronephrosis.   Electronically  Signed   By: Richardean Sale M.D.   On: 03/13/2015 12:04   Dg Abd Portable 1v-small Bowel Obstruction Protocol-initial, 8 Hr Delay  03/14/2015   CLINICAL DATA:  Small bowel obstruction.  EXAM: PORTABLE ABDOMEN - 1 VIEW  COMPARISON:  CT earlier this day at 1142 hr.  FINDINGS: Initial imaging obtained at 1507 hr demonstrates oral contrast from prior CT throughout the colon. An enteric tube is in place. There is excreted intravenous contrast in the urinary bladder. Enteric contrast was administered which opacifies the stomach.  Subsequent imaging obtained at 2341 hr demonstrates passage of enteric contrast, now visualize throughout the entire colon. No residual gastric or evident small bowel contrast is seen.  IMPRESSION: Oral contrast throughout the entire colon. No residual gastric or small bowel contrast is seen.   Electronically Signed   By: Jeb Levering M.D.   On: 03/14/2015 00:26   Medications:  Scheduled Meds: . enoxaparin (LOVENOX) injection  40 mg Subcutaneous Q24H  . Influenza vac split quadrivalent PF  0.5 mL Intramuscular Tomorrow-1000  . mometasone-formoterol  2 puff Inhalation BID  . pantoprazole (PROTONIX)  IV  40 mg Intravenous Q24H  . pneumococcal 23 valent vaccine  0.5 mL Intramuscular Tomorrow-1000   Continuous Infusions: . dextrose 5 % and 0.45% NaCl 125 mL/hr at 03/14/15 0426   PRN Meds:.albuterol, iohexol, morphine injection Assessment/Plan: Principal Problem:   Partial small bowel obstruction Active Problems:   OSA on CPAP   COPD (chronic obstructive pulmonary disease)   Anxiety   Fibromyalgia  44 y.o. male with past medical history of SBO, COPD, asthma, tobacco use, fibromyalgia, restless legs who presents to the emergency department with worsening abdominal pain since last Friday.   Small Bowel Obstruction: likely secondary to adhesions from prior abdominal surgeries -General surgery following, appreciate their assistance.  They are signing off. -Afebrile, no  leukocytosis, diarrhea started again -Abdominal x-ray oral contrast throughout the entire colon. No residual gastric or small bowel contrast is seen.  -NG tube discontinued, diet advanced to clears, will advance as tolerated -D5-1/2NS IV fluids at 125cc/hr -Morphine 2mg  q3h prn  Hypokalemia: 3.4 on admission -magnesium normal -3.3 today, given 4 runs of 20mEq KCl -monitor BMPs  COPD -Xopenex q6h prn -Dulera BID -hold Singulair  GERD -hold Nexium, Famotidine -Protonix 40mg  IV q24h   Dispo: Disposition is deferred at this time, awaiting improvement of current medical problems.    The patient does have a current PCP [name], NP) and does not need an [facility] hospital follow-up appointment after discharge.  The patient does not have transportation limitations that hinder transportation to clinic appointments.    LOS: 1 day   Jule Ser, DO 03/14/2015, 2:13 PM

## 2015-03-14 NOTE — Care Management Note (Signed)
Case Management Note  Patient Details  Name: Kevin Ferguson MRN: [id number] Date of Birth: [birth year]  Subjective/Objective:                    Action/Plan: Received consult that patient cannot afford co pays . Spoke with patient and wife at bedside . Patient and wife stated they can afford co pays , in ED they were told they had a $50 co pay for visit . Wife didn't have $50 at the time so staff in ED said they will bill her . Wife gets paid today and can afford co pay . No other needs identified at this time.   Expected Discharge Date:                  Expected Discharge Plan:  Home/Self Care  In-House Referral:     Discharge planning Services  CM Consult  Post Acute Care Choice:    Choice offered to:     DME Arranged:    DME Agency:     HH Arranged:    HH Agency:     Status of Service:  In process, will continue to follow  Medicare Important Message Given:    Date Medicare IM Given:    Medicare IM give by:    Date Additional Medicare IM Given:    Additional Medicare Important Message give by:     If discussed at [place] of Stay Meetings, dates discussed:    Additional Comments:  Marilu Favre, RN 03/14/2015, 10:04 AM

## 2015-03-14 NOTE — Progress Notes (Signed)
Nutrition Brief Note  Patient identified on the Malnutrition Screening Tool (MST) Report  Wt Readings from Last 15 Encounters:  03/13/15 192 lb 12.8 oz (87.454 kg)  02/07/15 195 lb (88.451 kg)  02/01/15 197 lb (89.359 kg)  01/11/15 196 lb 12.8 oz (89.268 kg)  12/22/14 193 lb 12.8 oz (87.907 kg)  12/08/14 193 lb 9.6 oz (87.816 kg)   Kevin Ferguson is a 44 year old male with a history of exploratory laparotomy for perforated appendicitis at age 56, exploratory laparotomy in 2014 for small bowel obstruction, vasectomy, COPD, tobacco use, prediabetes, FMA, RLS presenting with a 4 day history of abdominal pain and watery diarrhea. He stopped having diarrhea today and therefore came to the emergency room. Symptoms are severe, constant. Location is generalized abdomen without any radiation. Denies nausea or vomiting. Aggravated by food. He has been eating toast, crackers and drinking fluids. Denies fever, chills or sweats. Denies ill close contacts. No flatus today. His work up shows a normal white count, low k, normal UA. A CT of abdomen and pelvis significant for PSBO/SBO. We have therefore been asked to evaluate.   Pt admitted with PSBO.  NGT was d/c this AM. Pt was advanced to a clear liquid diet. Meal completion 100%. Pt reports no changes in appetite PTA or currently; tolerated his clear liquid diet well. Pt wife at bedside report pt has GERD and occasionally has swallowing difficulty.  Pt reports UBW of 198#. He denies any changes to his activity level or clothes fitting. Nutrition-Focused physical exam completed. Findings are no fat depletion, no muscle depletion, and no edema.   Body mass index is 27.66 kg/(m^2). Patient meets criteria for overweight based on current BMI.   Current diet order is clear liquid, patient is consuming approximately 100% of meals at this time. Labs and medications reviewed.   No nutrition interventions warranted at this time. If nutrition issues  arise, please consult RD.   Jenifer A. Jimmye Norman, RD, LDN, CDE Pager: [phone] After hours Pager: [phone]

## 2015-03-15 ENCOUNTER — Inpatient Hospital Stay (HOSPITAL_COMMUNITY): Payer: BLUE CROSS/BLUE SHIELD

## 2015-03-15 DIAGNOSIS — R509 Fever, unspecified: Secondary | ICD-10-CM

## 2015-03-15 DIAGNOSIS — F418 Other specified anxiety disorders: Secondary | ICD-10-CM

## 2015-03-15 DIAGNOSIS — K56609 Unspecified intestinal obstruction, unspecified as to partial versus complete obstruction: Secondary | ICD-10-CM | POA: Diagnosis present

## 2015-03-15 LAB — CBC WITH DIFFERENTIAL/PLATELET
BASOS PCT: 0 %
Basophils Absolute: 0 10*3/uL (ref 0.0–0.1)
EOS ABS: 0.1 10*3/uL (ref 0.0–0.7)
Eosinophils Relative: 1 %
HEMATOCRIT: 41.4 % (ref 39.0–52.0)
HEMOGLOBIN: 14 g/dL (ref 13.0–17.0)
Lymphocytes Relative: 13 %
Lymphs Abs: 1.4 10*3/uL (ref 0.7–4.0)
MCH: 32.1 pg (ref 26.0–34.0)
MCHC: 33.8 g/dL (ref 30.0–36.0)
MCV: 95 fL (ref 78.0–100.0)
Monocytes Absolute: 0.9 10*3/uL (ref 0.1–1.0)
Monocytes Relative: 9 %
NEUTROS ABS: 8.5 10*3/uL — AB (ref 1.7–7.7)
NEUTROS PCT: 77 %
Platelets: 277 10*3/uL (ref 150–400)
RBC: 4.36 MIL/uL (ref 4.22–5.81)
RDW: 13.1 % (ref 11.5–15.5)
WBC: 10.9 10*3/uL — AB (ref 4.0–10.5)

## 2015-03-15 LAB — BASIC METABOLIC PANEL
ANION GAP: 7 (ref 5–15)
BUN: <5 mg/dL — ABNORMAL LOW (ref 6–20)
CHLORIDE: 105 mmol/L (ref 101–111)
CO2: 28 mmol/L (ref 22–32)
Calcium: 9.3 mg/dL (ref 8.9–10.3)
Creatinine, Ser: 0.97 mg/dL (ref 0.61–1.24)
GFR calc non Af Amer: >60 mL/min (ref >60)
Glucose, Bld: 122 mg/dL — ABNORMAL HIGH (ref 65–99)
Potassium: 4.3 mmol/L (ref 3.5–5.1)
SODIUM: 140 mmol/L (ref 135–145)

## 2015-03-15 LAB — HIV ANTIBODY (ROUTINE TESTING W REFLEX): HIV SCREEN 4TH GENERATION: NONREACTIVE

## 2015-03-15 LAB — HEPATITIS C ANTIBODY: HCV Ab: <0.1 s/co ratio (ref 0.0–0.9)

## 2015-03-15 MED ORDER — MONTELUKAST SODIUM 10 MG PO TABS
10.0000 mg | ORAL_TABLET | Freq: Every day | ORAL | Status: DC
  Administered 2015-03-15 – 2015-03-16 (×2): 10 mg via ORAL
  Filled 2015-03-15 (×2): qty 1

## 2015-03-15 MED ORDER — TOPIRAMATE 25 MG PO TABS
50.0000 mg | ORAL_TABLET | Freq: Two times a day (BID) | ORAL | Status: DC
  Administered 2015-03-15 – 2015-03-17 (×5): 50 mg via ORAL
  Filled 2015-03-15 (×7): qty 2

## 2015-03-15 MED ORDER — HYDROCODONE-ACETAMINOPHEN 5-325 MG PO TABS
1.0000 | ORAL_TABLET | ORAL | Status: DC | PRN
  Administered 2015-03-15 – 2015-03-17 (×5): 1 via ORAL
  Filled 2015-03-15 (×5): qty 1

## 2015-03-15 MED ORDER — PAROXETINE HCL 20 MG PO TABS
20.0000 mg | ORAL_TABLET | Freq: Every day | ORAL | Status: DC
  Administered 2015-03-15 – 2015-03-17 (×3): 20 mg via ORAL
  Filled 2015-03-15 (×4): qty 1

## 2015-03-15 MED ORDER — PANTOPRAZOLE SODIUM 40 MG PO TBEC
40.0000 mg | DELAYED_RELEASE_TABLET | Freq: Two times a day (BID) | ORAL | Status: DC
  Administered 2015-03-16 – 2015-03-17 (×3): 40 mg via ORAL
  Filled 2015-03-15 (×3): qty 1

## 2015-03-15 NOTE — Progress Notes (Signed)
Subjective: Patient feels bad since getting flu shot, has pain on the left arm where he got the flu vaccine yesterday. Had some low grade fever this AM in  100.1 then again 101.1. Still has some pain with eating. No BM today but has last yesterday. Is passing flatus. No dysuria or sob/cp.   Objective: Vital signs in last 24 hours: Filed Vitals:   03/14/15 2058 03/15/15 0630 03/15/15 0824 03/15/15 1132  BP: 115/65 107/69  122/72  Pulse: 64 95    Temp: 98.1 F (36.7 C) 100.1 F (37.8 C)  101.1 F (38.4 C)  TempSrc: Oral Oral  Oral  Resp: 18 20  20   Height:      Weight:      SpO2: 98% 98% 96% 95%   Weight change:   Intake/Output Summary (Last 24 hours) at 03/15/15 1347 Last data filed at 03/14/15 1421  Gross per 24 hour  Intake   1000 ml  Output      0 ml  Net   1000 ml   General: resting in bed, family present, looks tired HEENT: EOMI, no scleral icterus Cardiac: RRR, no rubs, murmurs or gallops Pulm: clear to auscultation bilaterally, moving normal volumes of air Abd: soft, nondistended, still tender in umbilical area, bowel sounds hypoactive Ext: warm and well perfused, no pedal edema. Left arm is tender where he had flu vaccine injected. Neuro: alert and oriented X3, cranial nerves II-XII grossly intact  Lab Results: Basic Metabolic Panel:  Recent Labs Lab 03/13/15 1912 03/14/15 0505 03/15/15 0312  NA  --  138 140  K  --  3.3* 4.3  CL  --  104 105  CO2  --  26 28  GLUCOSE  --  111* 122*  BUN  --  9 <5*  CREATININE  --  1.17 0.97  CALCIUM  --  8.7* 9.3  MG 2.0  --   --    Liver Function Tests:  Recent Labs Lab 03/13/15 1009 03/14/15 0505  AST 25 19  ALT 29 24  ALKPHOS 100 89  BILITOT 0.7 0.8  PROT 6.2* 5.9*  ALBUMIN 3.6 3.3*    Recent Labs Lab 03/13/15 1009  LIPASE 16*   No results for input(s): AMMONIA in the last 168 hours. CBC:  Recent Labs Lab 03/14/15 0505 03/15/15 0312  WBC 8.3 10.9*  NEUTROABS 5.2 8.5*  HGB 13.3 14.0  HCT  39.5 41.4  MCV 96.1 95.0  PLT 287 277   Cardiac Enzymes: No results for input(s): CKTOTAL, CKMB, CKMBINDEX, TROPONINI in the last 168 hours. BNP: No results for input(s): PROBNP in the last 168 hours. D-Dimer: No results for input(s): DDIMER in the last 168 hours. CBG: No results for input(s): GLUCAP in the last 168 hours. Hemoglobin A1C:  Recent Labs Lab 03/13/15 1009  HGBA1C 5.9*   Fasting Lipid Panel: No results for input(s): CHOL, HDL, LDLCALC, TRIG, CHOLHDL, LDLDIRECT in the last 168 hours. Thyroid Function Tests: No results for input(s): TSH, T4TOTAL, FREET4, T3FREE, THYROIDAB in the last 168 hours. Coagulation: No results for input(s): LABPROT, INR in the last 168 hours. Anemia Panel: No results for input(s): VITAMINB12, FOLATE, FERRITIN, TIBC, IRON, RETICCTPCT in the last 168 hours. Urine Drug Screen: Drugs of Abuse  No results found for: LABOPIA, COCAINSCRNUR, LABBENZ, AMPHETMU, THCU, LABBARB  Alcohol Level: No results for input(s): ETH in the last 168 hours. Urinalysis:  Recent Labs Lab 03/13/15 [place]  LABSPEC 1.030  PHURINE 8.5*  GLUCOSEU  NEGATIVE  HGBUR NEGATIVE  BILIRUBINUR NEGATIVE  KETONESUR NEGATIVE  PROTEINUR NEGATIVE  UROBILINOGEN 1.0  NITRITE NEGATIVE  LEUKOCYTESUR NEGATIVE   Micro Results: No results found for this or any previous visit (from the past 240 hour(s)). Studies/Results: Dg Abd Portable 1v  03/15/2015   CLINICAL DATA:  44 year old male with history of small-bowel obstruction and lower abdominal pain.  EXAM: PORTABLE ABDOMEN - 1 VIEW  COMPARISON:  Abdominal radiograph 03/13/2015.  FINDINGS: Oral contrast material from recent CT examination is again noted predominantly in the colon. This extends all the way to the level of the rectum. No pathologic dilatation of small bowel or colon. Prominent but nondilated loops of small bowel in the right side of the abdomen measure up to 3.5 mm cm in diameter. No gross  pneumoperitoneum identified on this single view examination. Metallic density in the cecum, similar to the prior examination.  IMPRESSION: 1. Nonspecific, nonobstructive bowel gas pattern, as above. 2. No pneumoperitoneum.   Electronically Signed   By: Vinnie Langton M.D.   On: 03/15/2015 12:53   Dg Abd Portable 1v-small Bowel Obstruction Protocol-initial, 8 Hr Delay  03/14/2015   CLINICAL DATA:  Small bowel obstruction.  EXAM: PORTABLE ABDOMEN - 1 VIEW  COMPARISON:  CT earlier this day at 1142 hr.  FINDINGS: Initial imaging obtained at 1507 hr demonstrates oral contrast from prior CT throughout the colon. An enteric tube is in place. There is excreted intravenous contrast in the urinary bladder. Enteric contrast was administered which opacifies the stomach.  Subsequent imaging obtained at 2341 hr demonstrates passage of enteric contrast, now visualize throughout the entire colon. No residual gastric or evident small bowel contrast is seen.  IMPRESSION: Oral contrast throughout the entire colon. No residual gastric or small bowel contrast is seen.   Electronically Signed   By: Jeb Levering M.D.   On: 03/14/2015 00:26   Medications:  Scheduled Meds: . enoxaparin (LOVENOX) injection  40 mg Subcutaneous Q24H  . mometasone-formoterol  2 puff Inhalation BID  . montelukast  10 mg Oral QHS  . pantoprazole (PROTONIX) IV  40 mg Intravenous Q24H  . PARoxetine  20 mg Oral Daily  . topiramate  50 mg Oral BID   Continuous Infusions: . dextrose 5 % and 0.45% NaCl 125 mL/hr at 03/15/15 1126   PRN Meds:.albuterol, HYDROcodone-acetaminophen, iohexol Assessment/Plan: Principal Problem:   Partial small bowel obstruction Active Problems:   OSA on CPAP   COPD (chronic obstructive pulmonary disease)   Anxiety   Fibromyalgia   Hypokalemia   Gastroesophageal reflux disease without esophagitis  44 y.o. male with past medical history of SBO, COPD, asthma, tobacco use, fibromyalgia, restless legs who  presents to the emergency department with worsening abdominal pain since last Friday.   Small Bowel Obstruction: likely secondary to adhesions from prior abdominal surgeries -General surgery followed and signed off 9/28. He had NGT which was dc/ed yesterday. Tolerating liquid diet now, has some ab pain with eating. Repeat KUB today shows nonspecific non obstructed bowel gas pattern. Has good BS today.  -cont D5-1/2NS IV fluids at 125cc/hr with his lo PO intake -Morphine 2mg  q3h prn >> Norco q4hr prn  Fever - 101.3 today - likely from flu vaccine.  no dysuria, sob, or any other source of infection on exam or history.  - Will monitor fever, did not treat with tylenol to avoid masking the fever. If fever continues, will get blood culture but will not start abx unless patient becomes septic.  Hypokalemia: repleted  COPD -Xopenex q6h prn -Dulera BID -hold Singulair  GERD -on protonix   Copd - on home meds(dulera + xopenex+singulair)  Depression/Anxiety - restarted paxil. Did not order xanax to avoid sedation.   Dispo: Disposition is deferred at this time, awaiting improvement of current medical problems.    The patient does have a current PCP [name], NP) and does not need an [facility] hospital follow-up appointment after discharge.  The patient does not have transportation limitations that hinder transportation to clinic appointments.    LOS: 2 days   Dellia Nims, MD 03/15/2015, 1:47 PM

## 2015-03-16 DIAGNOSIS — L03114 Cellulitis of left upper limb: Secondary | ICD-10-CM

## 2015-03-16 DIAGNOSIS — D72829 Elevated white blood cell count, unspecified: Secondary | ICD-10-CM

## 2015-03-16 DIAGNOSIS — B9689 Other specified bacterial agents as the cause of diseases classified elsewhere: Secondary | ICD-10-CM

## 2015-03-16 LAB — CBC WITH DIFFERENTIAL/PLATELET
BASOS ABS: 0.1 10*3/uL (ref 0.0–0.1)
BASOS PCT: 0 %
EOS PCT: 0 %
Eosinophils Absolute: 0 10*3/uL (ref 0.0–0.7)
HCT: 40.8 % (ref 39.0–52.0)
Hemoglobin: 14.2 g/dL (ref 13.0–17.0)
Lymphocytes Relative: 9 %
Lymphs Abs: 1.5 10*3/uL (ref 0.7–4.0)
MCH: 32.9 pg (ref 26.0–34.0)
MCHC: 34.8 g/dL (ref 30.0–36.0)
MCV: 94.4 fL (ref 78.0–100.0)
MONO ABS: 1.8 10*3/uL — AB (ref 0.1–1.0)
Monocytes Relative: 11 %
Neutro Abs: 13.2 10*3/uL — ABNORMAL HIGH (ref 1.7–7.7)
Neutrophils Relative %: 80 %
PLATELETS: 283 10*3/uL (ref 150–400)
RBC: 4.32 MIL/uL (ref 4.22–5.81)
RDW: 13.2 % (ref 11.5–15.5)
WBC: 16.6 10*3/uL — AB (ref 4.0–10.5)

## 2015-03-16 LAB — BASIC METABOLIC PANEL
ANION GAP: 11 (ref 5–15)
BUN: 5 mg/dL — AB (ref 6–20)
CALCIUM: 8.8 mg/dL — AB (ref 8.9–10.3)
CO2: 22 mmol/L (ref 22–32)
Chloride: 100 mmol/L — ABNORMAL LOW (ref 101–111)
Creatinine, Ser: 1.01 mg/dL (ref 0.61–1.24)
GFR calc Af Amer: >60 mL/min (ref >60)
GLUCOSE: 136 mg/dL — AB (ref 65–99)
Potassium: 3.2 mmol/L — ABNORMAL LOW (ref 3.5–5.1)
SODIUM: 133 mmol/L — AB (ref 135–145)

## 2015-03-16 MED ORDER — CLINDAMYCIN PHOSPHATE 600 MG/50ML IV SOLN
600.0000 mg | Freq: Three times a day (TID) | INTRAVENOUS | Status: DC
  Administered 2015-03-16 – 2015-03-17 (×3): 600 mg via INTRAVENOUS
  Filled 2015-03-16 (×5): qty 50

## 2015-03-16 MED ORDER — DIPHENHYDRAMINE HCL 50 MG/ML IJ SOLN
12.5000 mg | Freq: Once | INTRAMUSCULAR | Status: AC
  Administered 2015-03-16: 12.5 mg via INTRAVENOUS
  Filled 2015-03-16: qty 1

## 2015-03-16 MED ORDER — POTASSIUM CHLORIDE 10 MEQ/100ML IV SOLN
10.0000 meq | INTRAVENOUS | Status: AC
  Administered 2015-03-16 (×4): 10 meq via INTRAVENOUS
  Filled 2015-03-16 (×4): qty 100

## 2015-03-16 MED ORDER — CEFAZOLIN SODIUM 1-5 GM-% IV SOLN
1.0000 g | Freq: Three times a day (TID) | INTRAVENOUS | Status: DC
  Administered 2015-03-16 – 2015-03-17 (×3): 1 g via INTRAVENOUS
  Filled 2015-03-16 (×5): qty 50
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
## 2015-03-16 NOTE — Progress Notes (Signed)
Subjective: Patient seen and examined at bedside.  Abdominal pain is better, still not having bowel movements.  Last BM was watery diarrhea on Wednesday evening.  Does report good amount of flatus.  Site where he received his flu shot Wednesday afternoon is more erythematous and painful.  States he has some pain with elbow flexion of left arm.  Afebrile overnight.  No nausea or vomiting.  No urinary complaints.     Objective: Vital signs in last 24 hours: Filed Vitals:   03/15/15 1424 03/15/15 2126 03/15/15 2221 03/16/15 0357  BP: 112/66  109/66 108/61  Pulse: 93  76 72  Temp: 100.6 F (38.1 C)  99.1 F (37.3 C) 99.2 F (37.3 C)  TempSrc: Oral  Oral Oral  Resp:   18 18  Height:      Weight:      SpO2: 95% 92% 97% 99%   Weight change:   Intake/Output Summary (Last 24 hours) at 03/16/15 1311 Last data filed at 03/16/15 0900  Gross per 24 hour  Intake    700 ml  Output      2 ml  Net    698 ml   General: resting in bed, family present HEENT: EOMI, no scleral icterus Cardiac: RRR, no rubs, murmurs or gallops Pulm: clear to auscultation bilaterally, moving normal volumes of air Abd: soft, nondistended, bowel sounds present, distracted while palpating abdomen with no signs of tender Ext: warm and well perfused, no pedal edema. Left arm is tender where he had flu vaccine injected as well as having large area of well demarcated, blanchable erythema. Neuro: alert and oriented X3, cranial nerves II-XII grossly intact  Lab Results: Basic Metabolic Panel:  Recent Labs Lab 03/13/15 1912  03/15/15 0312 03/16/15 0328  NA  --   < > 140 133*  K  --   < > 4.3 3.2*  CL  --   < > 105 100*  CO2  --   < > 28 22  GLUCOSE  --   < > 122* 136*  BUN  --   < > <5* 5*  CREATININE  --   < > 0.97 1.01  CALCIUM  --   < > 9.3 8.8*  MG 2.0  --   --   --   < > = values in this interval not displayed. Liver Function Tests:  Recent Labs Lab 03/13/15 1009 03/14/15 0505  AST 25 19  ALT 29  24  ALKPHOS 100 89  BILITOT 0.7 0.8  PROT 6.2* 5.9*  ALBUMIN 3.6 3.3*    Recent Labs Lab 03/13/15 1009  LIPASE 16*   No results for input(s): AMMONIA in the last 168 hours. CBC:  Recent Labs Lab 03/15/15 0312 03/16/15 0328  WBC 10.9* 16.6*  NEUTROABS 8.5* 13.2*  HGB 14.0 14.2  HCT 41.4 40.8  MCV 95.0 94.4  PLT 277 283   Cardiac Enzymes: No results for input(s): CKTOTAL, CKMB, CKMBINDEX, TROPONINI in the last 168 hours. BNP: No results for input(s): PROBNP in the last 168 hours. D-Dimer: No results for input(s): DDIMER in the last 168 hours. CBG: No results for input(s): GLUCAP in the last 168 hours. Hemoglobin A1C:  Recent Labs Lab 03/13/15 1009  HGBA1C 5.9*   Fasting Lipid Panel: No results for input(s): CHOL, HDL, LDLCALC, TRIG, CHOLHDL, LDLDIRECT in the last 168 hours. Thyroid Function Tests: No results for input(s): TSH, T4TOTAL, FREET4, T3FREE, THYROIDAB in the last 168 hours. Coagulation: No results for input(s): LABPROT,  INR in the last 168 hours. Anemia Panel: No results for input(s): VITAMINB12, FOLATE, FERRITIN, TIBC, IRON, RETICCTPCT in the last 168 hours. Urine Drug Screen: Drugs of Abuse  No results found for: LABOPIA, COCAINSCRNUR, LABBENZ, AMPHETMU, THCU, LABBARB  Alcohol Level: No results for input(s): ETH in the last 168 hours. Urinalysis:  Recent Labs Lab 03/13/15 1121  COLORURINE YELLOW  LABSPEC 1.030  PHURINE 8.5*  GLUCOSEU NEGATIVE  HGBUR NEGATIVE  BILIRUBINUR NEGATIVE  KETONESUR NEGATIVE  PROTEINUR NEGATIVE  UROBILINOGEN 1.0  NITRITE NEGATIVE  LEUKOCYTESUR NEGATIVE   Micro Results: No results found for this or any previous visit (from the past 240 hour(s)). Studies/Results: Dg Abd Portable 1v  03/15/2015   CLINICAL DATA:  44 year old male with history of small-bowel obstruction and lower abdominal pain.  EXAM: PORTABLE ABDOMEN - 1 VIEW  COMPARISON:  Abdominal radiograph 03/13/2015.  FINDINGS: Oral contrast material from  recent CT examination is again noted predominantly in the colon. This extends all the way to the level of the rectum. No pathologic dilatation of small bowel or colon. Prominent but nondilated loops of small bowel in the right side of the abdomen measure up to 3.5 mm cm in diameter. No gross pneumoperitoneum identified on this single view examination. Metallic density in the cecum, similar to the prior examination.  IMPRESSION: 1. Nonspecific, nonobstructive bowel gas pattern, as above. 2. No pneumoperitoneum.   Electronically Signed   By: Vinnie Langton M.D.   On: 03/15/2015 12:53   Medications:  Scheduled Meds: .  ceFAZolin (ANCEF) IV  1 g Intravenous Q8H  . diphenhydrAMINE  12.5 mg Intravenous Once  . enoxaparin (LOVENOX) injection  40 mg Subcutaneous Q24H  . mometasone-formoterol  2 puff Inhalation BID  . montelukast  10 mg Oral QHS  . pantoprazole  40 mg Oral BID  . PARoxetine  20 mg Oral Daily  . potassium chloride  10 mEq Intravenous Q1 Hr x 4  . topiramate  50 mg Oral BID   Continuous Infusions:   PRN Meds:.albuterol, HYDROcodone-acetaminophen, iohexol Assessment/Plan: Principal Problem:   Partial small bowel obstruction Active Problems:   OSA on CPAP   COPD (chronic obstructive pulmonary disease)   Anxiety   Fibromyalgia   Hypokalemia   Gastroesophageal reflux disease without esophagitis   SBO (small bowel obstruction)  44 y.o. male with past medical history of SBO, COPD, asthma, tobacco use, fibromyalgia, restless legs who presents to the emergency department with worsening abdominal pain since last Friday.   Small Bowel Obstruction: likely secondary to adhesions from prior abdominal surgeries -General surgery followed and signed off 9/28. He had NGT which was discontinued 9/28. Tolerating liquid diet now, abdominal pain has improved. Repeat KUB today shows nonspecific non obstructed bowel gas pattern. Has good BS today, flatus.  No BM since Wednesday. -advance diet as  tolerated  -discontinue IV fluids -Morphine 2mg  q3h prn >> Norco q4hr prn  Cellulitis of Left Upper Extremity: at site of flu vaccine, likely source of fever and elevation of white count -WBC 10.9 >> 16.6 w/ 80% PMNs -Afebrile overnight, Tmax last 24 hours 101.9 -Continue to monitor WBC and fever -Cefazolin 1g IV q8h.  Will treat for 7 days. -Clindaymycin 600mg  IV q8h x 2 days  Hypokalemia -replete PRN  COPD -Xopenex q6h prn -Dulera BID -Singulair daily  GERD -Protonix BID  Depression/Anxiety  -restarted Paxil. Did not order Xanax to avoid sedation.  Dispo: Disposition is deferred at this time, awaiting improvement of current medical problems.    The  patient does have a current PCP [name], NP) and does not need an [facility] hospital follow-up appointment after discharge.  The patient does not have transportation limitations that hinder transportation to clinic appointments.    LOS: 3 days   Jule Ser, DO 03/16/2015, 1:11 PM

## 2015-03-17 LAB — CBC WITH DIFFERENTIAL/PLATELET
BASOS ABS: 0.1 10*3/uL (ref 0.0–0.1)
BASOS PCT: 1 %
EOS ABS: 0.2 10*3/uL (ref 0.0–0.7)
Eosinophils Relative: 2 %
HEMATOCRIT: 40.6 % (ref 39.0–52.0)
HEMOGLOBIN: 13.9 g/dL (ref 13.0–17.0)
Lymphocytes Relative: 17 %
Lymphs Abs: 1.8 10*3/uL (ref 0.7–4.0)
MCH: 32.6 pg (ref 26.0–34.0)
MCHC: 34.2 g/dL (ref 30.0–36.0)
MCV: 95.3 fL (ref 78.0–100.0)
Monocytes Absolute: 1.3 10*3/uL — ABNORMAL HIGH (ref 0.1–1.0)
Monocytes Relative: 12 %
NEUTROS ABS: 7.6 10*3/uL (ref 1.7–7.7)
NEUTROS PCT: 70 %
Platelets: 257 10*3/uL (ref 150–400)
RBC: 4.26 MIL/uL (ref 4.22–5.81)
RDW: 13.3 % (ref 11.5–15.5)
WBC: 11 10*3/uL — ABNORMAL HIGH (ref 4.0–10.5)

## 2015-03-17 LAB — BASIC METABOLIC PANEL
ANION GAP: 10 (ref 5–15)
BUN: 7 mg/dL (ref 6–20)
CALCIUM: 9.1 mg/dL (ref 8.9–10.3)
CO2: 21 mmol/L — ABNORMAL LOW (ref 22–32)
CREATININE: 0.88 mg/dL (ref 0.61–1.24)
Chloride: 104 mmol/L (ref 101–111)
Glucose, Bld: 114 mg/dL — ABNORMAL HIGH (ref 65–99)
Potassium: 3.9 mmol/L (ref 3.5–5.1)
SODIUM: 135 mmol/L (ref 135–145)

## 2015-03-17 MED ORDER — CEPHALEXIN 500 MG PO CAPS: 500.0000 mg | ORAL_CAPSULE | Freq: Four times a day (QID) | ORAL | Status: DC

## 2015-03-17 MED ORDER — CEPHALEXIN 500 MG PO CAPS
500.0000 mg | ORAL_CAPSULE | Freq: Four times a day (QID) | ORAL | Status: DC
  Administered 2015-03-17: 500 mg via ORAL
  Filled 2015-03-17: qty 1

## 2015-03-17 NOTE — Progress Notes (Signed)
Subjective: NAEON. Patient reports he has been able to eat roast beef sandwich and drink fluids without pain.  He denies abdominal pain.  He endorses continued loose stool, but thinks that he can go home.  He believes the redness of LUE has decreased.  He denies rash following Cefazolin administration.  Objective: Vital signs in last 24 hours: Filed Vitals:   03/16/15 2111 03/16/15 2208 03/17/15 0456 03/17/15 0726  BP:  109/67 110/70 95/54  Pulse:  73 69 63  Temp:  99 F (37.2 C) 97.9 F (36.6 C) 98.4 F (36.9 C)  TempSrc:  Oral Oral Oral  Resp:  17 18 18   Height:      Weight:      SpO2: 98% 100% 98% 99%   Weight change:   Intake/Output Summary (Last 24 hours) at 03/17/15 0957 Last data filed at 03/17/15 0955  Gross per 24 hour  Intake   1340 ml  Output     11 ml  Net   1329 ml   Physical Exam  Constitutional: He is oriented to person, place, and time and well-developed, well-nourished, and in no distress. No distress.  HENT:  Head: Normocephalic and atraumatic.  Eyes: EOM are normal. No scleral icterus.  Neck: No JVD present. No tracheal deviation present.  Cardiovascular: Normal rate, regular rhythm and normal heart sounds.   Pulmonary/Chest: Effort normal. No respiratory distress. He has no wheezes.  Abdominal: Soft. He exhibits no distension. There is no tenderness. There is no rebound and no guarding.  Musculoskeletal: Normal range of motion. He exhibits no edema.  Neurological: He is alert and oriented to person, place, and time.  Skin: Skin is warm and dry. He is not diaphoretic.  Poorly defined erythema of LUE, decreased in redness within previously drawn demarcation.  Nontender to palpation.    Lab Results: Basic Metabolic Panel:  Recent Labs Lab 03/13/15 1912  03/16/15 0328 03/17/15 0528  NA  --   < > 133* 135  K  --   < > 3.2* 3.9  CL  --   < > 100* 104  CO2  --   < > 22 21*  GLUCOSE  --   < > 136* 114*  BUN  --   < > 5* 7  CREATININE  --   < >  1.01 0.88  CALCIUM  --   < > 8.8* 9.1  MG 2.0  --   --   --   < > = values in this interval not displayed. Liver Function Tests:  Recent Labs Lab 03/13/15 1009 03/14/15 0505  AST 25 19  ALT 29 24  ALKPHOS 100 89  BILITOT 0.7 0.8  PROT 6.2* 5.9*  ALBUMIN 3.6 3.3*    Recent Labs Lab 03/13/15 1009  LIPASE 16*   No results for input(s): AMMONIA in the last 168 hours. CBC:  Recent Labs Lab 03/16/15 0328 03/17/15 0528  WBC 16.6* 11.0*  NEUTROABS 13.2* 7.6  HGB 14.2 13.9  HCT 40.8 40.6  MCV 94.4 95.3  PLT 283 257   Cardiac Enzymes: No results for input(s): CKTOTAL, CKMB, CKMBINDEX, TROPONINI in the last 168 hours. BNP: No results for input(s): PROBNP in the last 168 hours. D-Dimer: No results for input(s): DDIMER in the last 168 hours. CBG: No results for input(s): GLUCAP in the last 168 hours. Hemoglobin A1C:  Recent Labs Lab 03/13/15 1009  HGBA1C 5.9*   Fasting Lipid Panel: No results for input(s): CHOL, HDL, LDLCALC, TRIG, CHOLHDL,  LDLDIRECT in the last 168 hours. Thyroid Function Tests: No results for input(s): TSH, T4TOTAL, FREET4, T3FREE, THYROIDAB in the last 168 hours. Coagulation: No results for input(s): LABPROT, INR in the last 168 hours. Anemia Panel: No results for input(s): VITAMINB12, FOLATE, FERRITIN, TIBC, IRON, RETICCTPCT in the last 168 hours. Urine Drug Screen: Drugs of Abuse  No results found for: LABOPIA, COCAINSCRNUR, LABBENZ, AMPHETMU, THCU, LABBARB  Alcohol Level: No results for input(s): ETH in the last 168 hours. Urinalysis:  Recent Labs Lab 03/13/15 1121  COLORURINE YELLOW  LABSPEC 1.030  PHURINE 8.5*  GLUCOSEU NEGATIVE  HGBUR NEGATIVE  BILIRUBINUR NEGATIVE  KETONESUR NEGATIVE  PROTEINUR NEGATIVE  UROBILINOGEN 1.0  NITRITE NEGATIVE  LEUKOCYTESUR NEGATIVE   Misc. Labs:   Micro Results: No results found for this or any previous visit (from the past 240 hour(s)). Studies/Results: Dg Abd Portable 1v  03/15/2015    CLINICAL DATA:  44 year old male with history of small-bowel obstruction and lower abdominal pain.  EXAM: PORTABLE ABDOMEN - 1 VIEW  COMPARISON:  Abdominal radiograph 03/13/2015.  FINDINGS: Oral contrast material from recent CT examination is again noted predominantly in the colon. This extends all the way to the level of the rectum. No pathologic dilatation of small bowel or colon. Prominent but nondilated loops of small bowel in the right side of the abdomen measure up to 3.5 mm cm in diameter. No gross pneumoperitoneum identified on this single view examination. Metallic density in the cecum, similar to the prior examination.  IMPRESSION: 1. Nonspecific, nonobstructive bowel gas pattern, as above. 2. No pneumoperitoneum.   Electronically Signed   By: Vinnie Langton M.D.   On: 03/15/2015 12:53   Medications:  Scheduled Meds: .  ceFAZolin (ANCEF) IV  1 g Intravenous Q8H  . clindamycin (CLEOCIN) IV  600 mg Intravenous 3 times per day  . enoxaparin (LOVENOX) injection  40 mg Subcutaneous Q24H  . mometasone-formoterol  2 puff Inhalation BID  . montelukast  10 mg Oral QHS  . pantoprazole  40 mg Oral BID  . PARoxetine  20 mg Oral Daily  . topiramate  50 mg Oral BID   Continuous Infusions:  PRN Meds:.albuterol, HYDROcodone-acetaminophen, iohexol Assessment/Plan: Principal Problem:   Cellulitis of left upper extremity Active Problems:   OSA on CPAP   Partial small bowel obstruction   COPD (chronic obstructive pulmonary disease)   Anxiety   Fibromyalgia   Hypokalemia   Gastroesophageal reflux disease without esophagitis   SBO (small bowel obstruction)  44 y.o. male with past medical history of SBO, COPD, asthma, tobacco use, fibromyalgia, restless legs who presents to the emergency department with worsening abdominal pain since last Friday.   Small Bowel Obstruction: likely secondary to adhesions from prior abdominal surgeries -General surgery followed and signed off 9/28. He had NGT which  was discontinued 9/28. Tolerating full diet without abdominal pain. Repeat KUB today shows nonspecific non obstructed bowel gas pattern. Patient passing stool, though it is loose.  Tolerated full diet and PO fluids. -advance diet as tolerated  -discontinue IV fluids -Norco q4hr prn  Cellulitis of Left Upper Extremity: at site of flu vaccine, likely source of fever and elevation of white count -WBC 10.9 >> 16.6 w/ 80% PMNs, now 11.0 (10/1) -Afebrile overnight -Continue to monitor WBC and fever -Cefazolin 1g IV q8h. Will transition to Keflex 500 mg QID and treat for 7 days. (9/30 - 10/6) - STOP Clindaymycin 600mg  IV q8h x 2 days (received 4 doses)  Hypokalemia -replete PRN  COPD -  Xopenex q6h prn -Dulera BID -Singulair daily  GERD -Protonix BID  Depression/Anxiety  -restarted Paxil. Did not order Xanax to avoid sedation.  Dispo: Discharge to home today   The patient does have a current PCP [name], NP) and does not need an [facility] follow-up appointment after discharge.  The patient does not have transportation limitations that hinder transportation to clinic appointments.      LOS: 4 days   Iline Oven, MD 03/17/2015, 9:57 AM

## 2015-03-17 NOTE — Discharge Instructions (Signed)
Take Cephalexin (Keflex) 500 mg four times daily until 10/6

## 2015-03-26 ENCOUNTER — Other Ambulatory Visit: Payer: Self-pay | Admitting: Urology

## 2015-03-30 ENCOUNTER — Encounter (HOSPITAL_BASED_OUTPATIENT_CLINIC_OR_DEPARTMENT_OTHER): Payer: Self-pay | Admitting: *Deleted

## 2015-03-30 ENCOUNTER — Other Ambulatory Visit: Payer: Self-pay | Admitting: Urology

## 2015-04-03 ENCOUNTER — Encounter (HOSPITAL_BASED_OUTPATIENT_CLINIC_OR_DEPARTMENT_OTHER): Payer: Self-pay | Admitting: *Deleted

## 2015-04-03 NOTE — Progress Notes (Signed)
NPO AFTER MN WITH EXCEPTION CLEAR LIQUIDS UNTIL 0700 (NO CREAM/ MILK PRODUCTS).  ARRIVE AT 1130.  CURRENT LAB RESULTS IN CHART AND EPIC.  CURRENT EKG, ECHO, AND STRESS TEST TO BE FAXED FROM [facility]. WILL TAKE AM MEDS W/ INHALER AM DOS W/ SIPS OF WATER.

## 2015-04-06 ENCOUNTER — Encounter (HOSPITAL_BASED_OUTPATIENT_CLINIC_OR_DEPARTMENT_OTHER)
Admission: RE | Disposition: A | Discharge status: Home / Self Care | Payer: Self-pay | Source: Ambulatory Visit | Attending: Urology

## 2015-04-06 ENCOUNTER — Ambulatory Visit (HOSPITAL_BASED_OUTPATIENT_CLINIC_OR_DEPARTMENT_OTHER): Payer: BLUE CROSS/BLUE SHIELD | Admitting: Anesthesiology

## 2015-04-06 ENCOUNTER — Ambulatory Visit (HOSPITAL_BASED_OUTPATIENT_CLINIC_OR_DEPARTMENT_OTHER)
Admission: RE | Admit: 2015-04-06 | Discharge: 2015-04-06 | Disposition: A | Discharge status: Home / Self Care | Payer: BLUE CROSS/BLUE SHIELD | Source: Ambulatory Visit | Attending: Urology | Admitting: Urology

## 2015-04-06 ENCOUNTER — Encounter (HOSPITAL_BASED_OUTPATIENT_CLINIC_OR_DEPARTMENT_OTHER): Payer: Self-pay

## 2015-04-06 DIAGNOSIS — F329 Major depressive disorder, single episode, unspecified: Secondary | ICD-10-CM | POA: Insufficient documentation

## 2015-04-06 DIAGNOSIS — Z79899 Other long term (current) drug therapy: Secondary | ICD-10-CM | POA: Diagnosis not present

## 2015-04-06 DIAGNOSIS — E785 Hyperlipidemia, unspecified: Secondary | ICD-10-CM | POA: Insufficient documentation

## 2015-04-06 DIAGNOSIS — J849 Interstitial pulmonary disease, unspecified: Secondary | ICD-10-CM | POA: Insufficient documentation

## 2015-04-06 DIAGNOSIS — J449 Chronic obstructive pulmonary disease, unspecified: Secondary | ICD-10-CM | POA: Diagnosis not present

## 2015-04-06 DIAGNOSIS — F17210 Nicotine dependence, cigarettes, uncomplicated: Secondary | ICD-10-CM | POA: Insufficient documentation

## 2015-04-06 DIAGNOSIS — Z7951 Long term (current) use of inhaled steroids: Secondary | ICD-10-CM | POA: Diagnosis not present

## 2015-04-06 DIAGNOSIS — R3129 Other microscopic hematuria: Secondary | ICD-10-CM | POA: Diagnosis present

## 2015-04-06 DIAGNOSIS — N210 Calculus in bladder: Secondary | ICD-10-CM | POA: Insufficient documentation

## 2015-04-06 DIAGNOSIS — G4733 Obstructive sleep apnea (adult) (pediatric): Secondary | ICD-10-CM | POA: Diagnosis not present

## 2015-04-06 DIAGNOSIS — K219 Gastro-esophageal reflux disease without esophagitis: Secondary | ICD-10-CM | POA: Insufficient documentation

## 2015-04-06 DIAGNOSIS — F419 Anxiety disorder, unspecified: Secondary | ICD-10-CM | POA: Diagnosis not present

## 2015-04-06 DIAGNOSIS — M797 Fibromyalgia: Secondary | ICD-10-CM | POA: Diagnosis not present

## 2015-04-06 HISTORY — DX: Personal history of other diseases of the digestive system: Z87.19

## 2015-04-06 HISTORY — DX: Interstitial pulmonary disease, unspecified: J84.9

## 2015-04-06 HISTORY — DX: Calculus in bladder: N21.0

## 2015-04-06 HISTORY — DX: Personal history of other medical treatment: Z92.89

## 2015-04-06 HISTORY — DX: Hyperlipidemia, unspecified: E78.5

## 2015-04-06 HISTORY — PX: HOLMIUM LASER APPLICATION: SHX5852

## 2015-04-06 HISTORY — PX: CYSTOSCOPY W/ RETROGRADES: SHX1426

## 2015-04-06 HISTORY — DX: Nocturia: R35.1

## 2015-04-06 LAB — GLUCOSE, CAPILLARY: GLUCOSE-CAPILLARY: 108 mg/dL — AB (ref 65–99)

## 2015-04-06 SURGERY — CYSTOSCOPY, WITH RETROGRADE PYELOGRAM
Anesthesia: General | Site: Bladder

## 2015-04-06 MED ORDER — CEFAZOLIN SODIUM-DEXTROSE 2-3 GM-% IV SOLR
2.0000 g | Freq: Once | INTRAVENOUS | Status: AC
  Administered 2015-04-06: 2 g via INTRAVENOUS
  Filled 2015-04-06: qty 50

## 2015-04-06 MED ORDER — FENTANYL CITRATE (PF) 100 MCG/2ML IJ SOLN
INTRAMUSCULAR | Status: AC
  Filled 2015-04-06: qty 4

## 2015-04-06 MED ORDER — CEFAZOLIN SODIUM-DEXTROSE 2-3 GM-% IV SOLR
INTRAVENOUS | Status: AC
  Filled 2015-04-06: qty 50

## 2015-04-06 MED ORDER — FENTANYL CITRATE (PF) 100 MCG/2ML IJ SOLN
INTRAMUSCULAR | Status: DC | PRN
  Administered 2015-04-06: 25 ug via INTRAVENOUS
  Administered 2015-04-06: 50 ug via INTRAVENOUS
  Administered 2015-04-06: 25 ug via INTRAVENOUS

## 2015-04-06 MED ORDER — OXYCODONE-ACETAMINOPHEN 5-325 MG PO TABS: 1.0000 | ORAL_TABLET | ORAL | Status: DC | PRN

## 2015-04-06 MED ORDER — LACTATED RINGERS IV SOLN
INTRAVENOUS | Status: DC
  Administered 2015-04-06: 12:00:00 via INTRAVENOUS
  Filled 2015-04-06: qty 1000

## 2015-04-06 MED ORDER — PROPOFOL 10 MG/ML IV BOLUS
INTRAVENOUS | Status: DC | PRN
  Administered 2015-04-06: 200 mg via INTRAVENOUS

## 2015-04-06 MED ORDER — DEXAMETHASONE SODIUM PHOSPHATE 4 MG/ML IJ SOLN
INTRAMUSCULAR | Status: DC | PRN
  Administered 2015-04-06: 10 mg via INTRAVENOUS

## 2015-04-06 MED ORDER — MIDAZOLAM HCL 2 MG/2ML IJ SOLN
INTRAMUSCULAR | Status: AC
  Filled 2015-04-06: qty 2

## 2015-04-06 MED ORDER — MIDAZOLAM HCL 5 MG/5ML IJ SOLN
INTRAMUSCULAR | Status: DC | PRN
Start: 2015-04-06 — End: 2015-04-06
  Administered 2015-04-06: 2 mg via INTRAVENOUS

## 2015-04-06 MED ORDER — LIDOCAINE HCL (CARDIAC) 20 MG/ML IV SOLN
INTRAVENOUS | Status: DC | PRN
  Administered 2015-04-06: 80 mg via INTRAVENOUS

## 2015-04-06 MED ORDER — PHENAZOPYRIDINE HCL 200 MG PO TABS: 200.0000 mg | ORAL_TABLET | Freq: Three times a day (TID) | ORAL | Status: DC | PRN

## 2015-04-06 MED ORDER — SODIUM CHLORIDE 0.9 % IR SOLN
Status: DC | PRN
  Administered 2015-04-06: 3000 mL

## 2015-04-06 MED ORDER — MEPERIDINE HCL 25 MG/ML IJ SOLN
6.2500 mg | INTRAMUSCULAR | Status: DC | PRN
  Filled 2015-04-06: qty 1

## 2015-04-06 MED ORDER — LACTATED RINGERS IV SOLN
INTRAVENOUS | Status: DC
  Filled 2015-04-06: qty 1000

## 2015-04-06 MED ORDER — FENTANYL CITRATE (PF) 100 MCG/2ML IJ SOLN
25.0000 ug | INTRAMUSCULAR | Status: DC | PRN
  Filled 2015-04-06: qty 1

## 2015-04-06 MED ORDER — PROMETHAZINE HCL 25 MG/ML IJ SOLN
6.2500 mg | INTRAMUSCULAR | Status: DC | PRN
  Filled 2015-04-06: qty 1

## 2015-04-06 MED ORDER — ONDANSETRON HCL 4 MG/2ML IJ SOLN
INTRAMUSCULAR | Status: DC | PRN
  Administered 2015-04-06: 4 mg via INTRAVENOUS

## 2015-04-06 MED ORDER — CEFAZOLIN SODIUM 1-5 GM-% IV SOLN
1.0000 g | Freq: Once | INTRAVENOUS | Status: DC
  Filled 2015-04-06: qty 50

## 2015-04-06 SURGICAL SUPPLY — 22 items
BAG URO CATCHER STRL LF (DRAPE) ×3 IMPLANT
BASKET ZERO TIP NITINOL 2.4FR (BASKET) IMPLANT
BSKT STON RTRVL ZERO TP 2.4FR (BASKET)
CATH INTERMIT  6FR 70CM (CATHETERS) ×2 IMPLANT
CLOTH BEACON ORANGE TIMEOUT ST (SAFETY) ×3 IMPLANT
FIBER LASER FLEXIVA 1000 (UROLOGICAL SUPPLIES) ×2 IMPLANT
GLOVE BIO SURGEON STRL SZ 6.5 (GLOVE) ×1 IMPLANT
GLOVE BIO SURGEON STRL SZ7.5 (GLOVE) ×5 IMPLANT
GLOVE BIO SURGEONS STRL SZ 6.5 (GLOVE) ×1
GLOVE BIOGEL PI IND STRL 6.5 (GLOVE) IMPLANT
GLOVE BIOGEL PI INDICATOR 6.5 (GLOVE) ×4
GOWN STRL REUS W/ TWL LRG LVL3 (GOWN DISPOSABLE) ×2 IMPLANT
GOWN STRL REUS W/ TWL XL LVL3 (GOWN DISPOSABLE) ×1 IMPLANT
GOWN STRL REUS W/TWL LRG LVL3 (GOWN DISPOSABLE) ×3
GOWN STRL REUS W/TWL XL LVL3 (GOWN DISPOSABLE) ×3
GUIDEWIRE STR DUAL SENSOR (WIRE) ×3 IMPLANT
IV NS IRRIG 3000ML ARTHROMATIC (IV SOLUTION) ×5 IMPLANT
KIT ROOM TURNOVER WOR (KITS) ×3 IMPLANT
MANIFOLD NEPTUNE II (INSTRUMENTS) ×2 IMPLANT
PACK CYSTO (CUSTOM PROCEDURE TRAY) ×3 IMPLANT
TUBE CONNECTING 12'X1/4 (SUCTIONS) ×1
TUBE CONNECTING 12X1/4 (SUCTIONS) ×1 IMPLANT

## 2015-04-06 NOTE — Brief Op Note (Signed)
04/06/2015  1:45 PM  PATIENT:  Kevin Ferguson  44 y.o. male  PRE-OPERATIVE DIAGNOSIS:  BLADDER CALCULI  POST-OPERATIVE DIAGNOSIS:  BLADDER CALCULI  PROCEDURE:  Procedure(s): CYSTOSCOPY WITH LITHOLAPAXY (N/A) POSSIBLE HOLMIUM LASER APPLICATION (N/A)  SURGEON:  Surgeon(s) and Role:    * Cleon Gustin, MD - Primary  PHYSICIAN ASSISTANT:   ASSISTANTS: none   ANESTHESIA:   general  EBL:  Total I/O In: 200 [I.V.:200] Out: -   BLOOD ADMINISTERED:none  DRAINS: none   LOCAL MEDICATIONS USED:  NONE  SPECIMEN:  Source of Specimen:  bladder stone  DISPOSITION OF SPECIMEN:  PATHOLOGY  COUNTS:  YES  TOURNIQUET:  * No tourniquets in log *  DICTATION: .Note written in EPIC  PLAN OF CARE: Discharge to home after PACU  PATIENT DISPOSITION:  PACU - hemodynamically stable.   Delay start of Pharmacological VTE agent (>24hrs) due to surgical blood loss or risk of bleeding: not applicable

## 2015-04-06 NOTE — Anesthesia Procedure Notes (Signed)
Procedure Name: LMA Insertion Date/Time: 04/06/2015 1:21 PM Performed by: Denna Haggard D Pre-anesthesia Checklist: Patient identified, Emergency Drugs available, Suction available and Patient being monitored Patient Re-evaluated:Patient Re-evaluated prior to inductionOxygen Delivery Method: Circle System Utilized Preoxygenation: Pre-oxygenation with 100% oxygen Intubation Type: IV induction Ventilation: Mask ventilation without difficulty LMA: LMA inserted LMA Size: 4.0 Number of attempts: 1 Airway Equipment and Method: Bite block Placement Confirmation: positive ETCO2 Tube secured with: Tape Dental Injury: Teeth and Oropharynx as per pre-operative assessment

## 2015-04-06 NOTE — Transfer of Care (Signed)
Immediate Anesthesia Transfer of Care Note  Patient: Kevin Ferguson  Procedure(s) Performed: Procedure(s) (LRB): CYSTOSCOPY WITH LITHOLAPAXY (N/A) POSSIBLE HOLMIUM LASER APPLICATION (N/A)  Patient Location: PACU  Anesthesia Type: General  Level of Consciousness: awake, oriented, sedated and patient cooperative  Airway & Oxygen Therapy: Patient Spontanous Breathing and Patient connected to face mask oxygen  Post-op Assessment: Report given to PACU RN and Post -op Vital signs reviewed and stable  Post vital signs: Reviewed and stable  Complications: No apparent anesthesia complications

## 2015-04-06 NOTE — Discharge Instructions (Signed)
CYSTOSCOPY HOME CARE INSTRUCTIONS  Activity: Rest for the remainder of the day.  Do not drive or operate equipment today.  You may resume normal activities in one to two days as instructed by your physician.   Meals: Drink plenty of liquids and eat light foods such as gelatin or soup this evening.  You may return to a normal meal plan tomorrow.  Return to Work: You may return to work in one to two days or as instructed by your physician.  Special Instructions / Symptoms: Call your physician if any of these symptoms occur:   -persistent or heavy bleeding  -bleeding which continues after first few urination  -large blood clots that are difficult to pass  -urine stream diminishes or stops completely  -fever equal to or higher than 101 degrees Farenheit.  -cloudy urine with a strong, foul odor  -severe pain  Females should always wipe from front to back after elimination.  You may feel some burning pain when you urinate.  This should disappear with time.  Applying moist heat to the lower abdomen or a hot tub bath may help relieve the pain. \  Follow-Up / Date of Return Visit to Your Physician:  Post Anesthesia Home Care Instructions  Activity: Get plenty of rest for the remainder of the day. A responsible adult should stay with you for 24 hours following the procedure.  For the next 24 hours, DO NOT: -Drive a car -Paediatric nurse -Drink alcoholic beverages -Take any medication unless instructed by your physician -Make any legal decisions or sign important papers.  Meals: Start with liquid foods such as gelatin or soup. Progress to regular foods as tolerated. Avoid greasy, spicy, heavy foods. If nausea and/or vomiting occur, drink only clear liquids until the nausea and/or vomiting subsides. Call your physician if vomiting continues.  Special Instructions/Symptoms: Your throat may feel dry or sore from the anesthesia or the breathing tube placed in your throat during surgery. If  this causes discomfort, gargle with warm salt water. The discomfort should disappear within 24 hours.  If you had a scopolamine patch placed behind your ear for the management of post- operative nausea and/or vomiting:  1. The medication in the patch is effective for 72 hours, after which it should be removed.  Wrap patch in a tissue and discard in the trash. Wash hands thoroughly with soap and water. 2. You may remove the patch earlier than 72 hours if you experience unpleasant side effects which may include dry mouth, dizziness or visual disturbances. 3. Avoid touching the patch. Wash your hands with soap and water after contact with the patch.    Call for an appointment to arrange follow-up.  Patient Signature:  ________________________________________________________  Nurse's Signature:  ________________________________________________________

## 2015-04-06 NOTE — Anesthesia Postprocedure Evaluation (Signed)
  Anesthesia Post-op Note  Patient: Kevin Ferguson  Procedure(s) Performed: Procedure(s) (LRB): CYSTOSCOPY WITH LITHOLAPAXY (N/A) POSSIBLE HOLMIUM LASER APPLICATION (N/A)  Patient Location: PACU  Anesthesia Type: General  Level of Consciousness: awake and alert   Airway and Oxygen Therapy: Patient Spontanous Breathing  Post-op Pain: mild  Post-op Assessment: Post-op Vital signs reviewed, Patient's Cardiovascular Status Stable, Respiratory Function Stable, Patent Airway and No signs of Nausea or vomiting  Last Vitals:  Filed Vitals:   04/06/15 1445  BP: 104/65  Pulse: 57  Temp:   Resp: 12    Post-op Vital Signs: stable   Complications: No apparent anesthesia complications

## 2015-04-06 NOTE — Anesthesia Preprocedure Evaluation (Addendum)
Anesthesia Evaluation  Patient identified by MRN, date of birth, ID band Patient awake    Reviewed: Allergy & Precautions, NPO status , Patient's Chart, lab work & pertinent test results  Airway Mallampati: II  TM Distance: >3 FB Neck ROM: Full    Dental no notable dental hx. (+) Poor Dentition   Pulmonary shortness of breath, sleep apnea and Continuous Positive Airway Pressure Ventilation , COPD,  COPD inhaler, Current Smoker,    Pulmonary exam normal breath sounds clear to auscultation       Cardiovascular negative cardio ROS Normal cardiovascular exam Rhythm:Regular Rate:Normal     Neuro/Psych negative neurological ROS  negative psych ROS   GI/Hepatic Neg liver ROS, GERD  Medicated and Controlled,  Endo/Other  negative endocrine ROS  Renal/GU negative Renal ROS  negative genitourinary   Musculoskeletal  (+) Fibromyalgia -  Abdominal   Peds negative pediatric ROS (+)  Hematology negative hematology ROS (+)   Anesthesia Other Findings   Reproductive/Obstetrics negative OB ROS                            Anesthesia Physical Anesthesia Plan  ASA: II  Anesthesia Plan: General   Post-op Pain Management:    Induction: Intravenous  Airway Management Planned: LMA  Additional Equipment:   Intra-op Plan:   Post-operative Plan: Extubation in OR  Informed Consent: I have reviewed the patients History and Physical, chart, labs and discussed the procedure including the risks, benefits and alternatives for the proposed anesthesia with the patient or authorized representative who has indicated his/her understanding and acceptance.   Dental advisory given  Plan Discussed with: CRNA  Anesthesia Plan Comments:         Anesthesia Quick Evaluation
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
## 2015-04-09 NOTE — Op Note (Signed)
Preoperative diagnosis: Bladder calculus  Postoperative diagnosis: Same  Procedure: 1 cystoscopy 2. cystolithalopaxy for stone <2.5cm  Attending: Rosie Fate  Anesthesia: General  Estimated blood loss: None  Drains: none  Specimens: bladder calculus  Antibiotics: ancef  Findings: 2cm bladder calculus. Ureteral orifices in normal anatomic location. No trabeculations in the bladder  Indications: Patient is a 44 year old male with a bladder stone found on CT scan.  After discussing treatment options, he decided proceed with cystolithalopaxy.  Procedure her in detail: The patient was brought to the operating room and a brief timeout was done to ensure correct patient, correct procedure, correct site.  General anesthesia was administered patient was placed in dorsal lithotomy position.  Her genitalia was then prepped and draped in usual sterile fashion.  A rigid 26 French cystoscope was passed in the urethra and the bladder.  Bladder was inspected free masses or lesions.  the ureteral orifices were in the normal orthotopic locations. We identified a 2cm bladder calculus. using using a 1000 nm laser fiber and fragmented the stone into smaller pieces.  the pieces were then removed with the cystoscope sheath.   the bladder was then drained and this concluded the procedure which was well tolerated by patient.  Complications: None  Condition: Stable, extubated, transferred to PACU  Plan: Patient is to be discharged home as to follow-up in one week.

## 2015-04-10 ENCOUNTER — Encounter (HOSPITAL_BASED_OUTPATIENT_CLINIC_OR_DEPARTMENT_OTHER): Payer: Self-pay | Admitting: Urology

## 2015-05-02 ENCOUNTER — Ambulatory Visit (INDEPENDENT_AMBULATORY_CARE_PROVIDER_SITE_OTHER)
Admission: RE | Admit: 2015-05-02 | Discharge: 2015-05-02 | Disposition: A | Discharge status: Home / Self Care | Payer: BLUE CROSS/BLUE SHIELD | Source: Ambulatory Visit | Attending: Internal Medicine | Admitting: Internal Medicine

## 2015-05-02 DIAGNOSIS — J849 Interstitial pulmonary disease, unspecified: Secondary | ICD-10-CM | POA: Diagnosis not present

## 2015-05-07 ENCOUNTER — Telehealth: Payer: Self-pay | Admitting: Internal Medicine

## 2015-05-07 NOTE — Telephone Encounter (Signed)
Kevin Ferguson 1st avail for fu. CT shows improvement. Has he quit smokign? IS he feeling better?  Thanks  Dr. Brand Males, M.D., [facility].C.P Pulmonary and Critical Care Medicine Staff Physician [place] Pager: [phone], If no answer or between  15:00h - 7:00h: call [phone]  05/07/2015 11:37 AM       Lungs/Pleura: There may be subtle upper lung zone predominant ill-defined centrilobular nodularity, which can be seen with respiratory bronchiolitis. No subpleural reticulation, traction bronchiectasis/ bronchiolectasis, ground-glass, architectural distortion or honeycombing. Probable linear subpleural scarring in the left lower lobe (image 39, series 5). No air trapping. No pleural fluid. Airway is unremarkable.

## 2015-05-07 NOTE — Telephone Encounter (Signed)
Called and spoke to pt. Informed pt of the results and recs per MR. Pt states she has not quit smoking. Pt states he has had no improvement in his s/s, pt states he has DOE with any activity and has a slight increase in non prod cough and still c/o chest tightness when active and SOB. Pt denies f/c/s, chest congestion.

## 2015-05-07 NOTE — Telephone Encounter (Signed)
atc pt on mobile #, call could not go through, was advised via automated message to try back later. atc pt on home #, no answer, no vm set up.  Wcb.

## 2015-05-07 NOTE — Telephone Encounter (Signed)
Give 1st avail appt fopr fu

## 2015-05-08 NOTE — Telephone Encounter (Signed)
Called spoke with pt. appt scheduled. Nothing further needed 

## 2015-06-07 ENCOUNTER — Ambulatory Visit (INDEPENDENT_AMBULATORY_CARE_PROVIDER_SITE_OTHER): Payer: BLUE CROSS/BLUE SHIELD | Admitting: Internal Medicine

## 2015-06-07 ENCOUNTER — Encounter: Payer: Self-pay | Admitting: Internal Medicine

## 2015-06-07 DIAGNOSIS — R0600 Dyspnea, unspecified: Secondary | ICD-10-CM | POA: Diagnosis not present

## 2015-06-07 MED ORDER — LEVALBUTEROL TARTRATE 45 MCG/ACT IN AERO: 2.0000 | INHALATION_SPRAY | Freq: Four times a day (QID) | RESPIRATORY_TRACT | Status: DC | PRN

## 2015-06-07 NOTE — Progress Notes (Signed)
Subjective:     Patient ID: Kevin Ferguson, male   DOB: 1971-04-17, 44 y.o.   MRN: [id number]  HPI   OV 06/07/2015  Chief Complaint  Patient presents with  . Follow-up    Pt states his breathing has slightly worsened since last OV - increase in SOB. Pt c/o mild non prod cough. Pt denies CP/tightness.    Dyspnea is present. It is unrelieved. Is present on exertion and improved by rest. Even though it is also sometimes present at rest. He says he has not worked in 1 year because of dyspnea. He is very frustrated y that doctors have not been able to figure out his dyspnea. Follow-up CT chest in November 2016 for concern of respiratory bronchitis interstitial lung disease shows resolution of those infiltrates. He continues to smoke. There are no other new issues. He is undergoing evaluation for his back at [facility] neurology   reports that he has been smoking Cigarettes.  He has a 26 pack-year smoking history. He has never used smokeless tobacco.    Current outpatient prescriptions:  .  ALPRAZolam (XANAX) 0.25 MG tablet, Take 0.25 mg by mouth daily as needed for anxiety. , Disp: , Rfl:  .  atorvastatin (LIPITOR) 20 MG tablet, Take 20 mg by mouth every evening. , Disp: , Rfl:  .  esomeprazole (NEXIUM) 40 MG capsule, Take 40 mg by mouth 2 (two) times daily before a meal., Disp: , Rfl:  .  levalbuterol (XOPENEX HFA) 45 MCG/ACT inhaler, Inhale 2 puffs into the lungs every 6 (six) hours as needed for shortness of breath. , Disp: , Rfl:  .  meclizine (ANTIVERT) 25 MG tablet, Take 25 mg by mouth at bedtime. , Disp: , Rfl:  .  mometasone-formoterol (DULERA) 100-5 MCG/ACT AERO, Take 2 puffs first thing in am and then another 2 puffs about 12 hours later., Disp: 1 Inhaler, Rfl: 11 .  montelukast (SINGULAIR) 10 MG tablet, Take 10 mg by mouth at bedtime., Disp: , Rfl:  .  nitroGLYCERIN (NITROSTAT) 0.3 MG SL tablet, Place 0.3 mg under the tongue as needed., Disp: , Rfl:  .  PARoxetine HCl (PAXIL PO),  Take 60 mg by mouth every evening. Takes one 20 mg tab and one 40 mg tab = 60 mg, Disp: , Rfl:  .  pregabalin (LYRICA) 150 MG capsule, Take 150 mg by mouth 2 (two) times daily. , Disp: , Rfl:  .  rOPINIRole (REQUIP) 2 MG tablet, Take 2 mg by mouth at bedtime. , Disp: , Rfl:  .  testosterone cypionate (DEPOTESTOSTERONE CYPIONATE) 200 MG/ML injection, Inject into the muscle every 28 (twenty-eight) days. Every month, Disp: , Rfl:  .  topiramate (TOPAMAX) 50 MG tablet, Take 75 mg by mouth 2 (two) times daily. , Disp: , Rfl:      Immunization History  Administered Date(s) Administered  . Influenza,inj,Quad PF,36+ Mos 03/14/2015  . Pneumococcal Polysaccharide-23 03/14/2015      Review of Systems Per history of present illness    Objective:   Physical Exam  Constitutional: He is oriented to person, place, and time. He appears well-developed and well-nourished. No distress.  HENT:  Head: Normocephalic and atraumatic.  Right Ear: External ear normal.  Left Ear: External ear normal.  Mouth/Throat: Oropharynx is clear and moist. No oropharyngeal exudate.  Eyes: Conjunctivae and EOM are normal. Pupils are equal, round, and reactive to light. Right eye exhibits no discharge. Left eye exhibits no discharge. No scleral icterus.  Neck: Normal range  of motion. Neck supple. No JVD present. No tracheal deviation present. No thyromegaly present.  Cardiovascular: Normal rate, regular rhythm and intact distal pulses.  Exam reveals no gallop and no friction rub.   No murmur heard. Pulmonary/Chest: Effort normal and breath sounds normal. No respiratory distress. He has no wheezes. He has no rales. He exhibits no tenderness.  Abdominal: Soft. Bowel sounds are normal. He exhibits no distension and no mass. There is no tenderness. There is no rebound and no guarding.  Musculoskeletal: Normal range of motion. He exhibits no edema or tenderness.  Lymphadenopathy:    He has no cervical adenopathy.   Neurological: He is alert and oriented to person, place, and time. He has normal reflexes. No cranial nerve deficit. Coordination normal.  Skin: Skin is warm and dry. No rash noted. He is not diaphoretic. No erythema. No pallor.  Psychiatric:  Extreme flat affect with y sterne stoic face  Nursing note and vitals reviewed.   Filed Vitals:   06/07/15 1504  BP: 140/88  Pulse: 82  Height: 5\' 10"  (1.778 m)  Weight: 198 lb (89.812 kg)  SpO2: 97%        Assessment:       ICD-9-CM ICD-10-CM   1. Dyspnea 786.09 R06.00    Dyspnea remains unexplained despite extensive investigation. Concern for respiratory bronchiolitis interstitial lung disease on account of smoking remained but a follow-up CT chest since November 2016 shows interval resolution of these. Therefore these do not have an expression for dyspnea    Plan:     Will do right heart catheterization as last effort in trying to figure out his dyspnea - he prefers to see Dr. Ellyn Hack at the [facility] heart [facility]   If this is normal we'll refer him to [facility] where his neurologist this but he will see the lung specialist that  Dr. Brand Males, M.D., [facility].C.P Pulmonary and Critical Care Medicine Staff Physician [facility] Pager: [phone], If no answer or between  15:00h - 7:00h: call [phone]  06/07/2015 3:47 PM

## 2015-06-07 NOTE — Addendum Note (Signed)
Addended by: Maurice March on: 06/07/2015 05:37 PM   Modules accepted: Orders

## 2015-06-07 NOTE — Patient Instructions (Addendum)
ICD-9-CM ICD-10-CM   1. Dyspnea 786.09 R06.00     Refer to Dr. Ellyn Hack [facility] heart care or [facility] for evaluation for right heart catheterization  Follow-up -  - If right heart catheterization is normal refer to [facility] pulmonary clinic for further evaluation of shortness of breath

## 2015-07-04 ENCOUNTER — Encounter: Payer: Self-pay | Admitting: Cardiology

## 2015-07-04 ENCOUNTER — Ambulatory Visit (INDEPENDENT_AMBULATORY_CARE_PROVIDER_SITE_OTHER): Payer: BLUE CROSS/BLUE SHIELD | Admitting: Cardiology

## 2015-07-04 VITALS — BP 120/92 | HR 75 | Ht 70.0 in | Wt 196.0 lb

## 2015-07-04 DIAGNOSIS — Z01818 Encounter for other preprocedural examination: Secondary | ICD-10-CM | POA: Insufficient documentation

## 2015-07-04 DIAGNOSIS — D689 Coagulation defect, unspecified: Secondary | ICD-10-CM

## 2015-07-04 DIAGNOSIS — Z720 Tobacco use: Secondary | ICD-10-CM

## 2015-07-04 DIAGNOSIS — F17210 Nicotine dependence, cigarettes, uncomplicated: Secondary | ICD-10-CM

## 2015-07-04 DIAGNOSIS — R931 Abnormal findings on diagnostic imaging of heart and coronary circulation: Secondary | ICD-10-CM | POA: Diagnosis not present

## 2015-07-04 DIAGNOSIS — R0609 Other forms of dyspnea: Secondary | ICD-10-CM

## 2015-07-04 NOTE — Assessment & Plan Note (Signed)
This really are to tell, he would expect a 45 year old gentleman without diabetes to have anginal symptoms. He is having some chest discomfort but mostly his exertional dyspnea. Again with him having concerning symptoms and a nuclear stress test showing reduced resting EF, I think is reasonable to proceed with diagnostic cardiac evaluation with possible reversibility trials.

## 2015-07-04 NOTE — Patient Instructions (Signed)
Your physician has requested that you have an echocardiogram at [address]. Echocardiography is a painless test that uses sound waves to create images of your heart. It provides your doctor with information about the size and shape of your heart and how well your heart's chambers and valves are working. This procedure takes approximately one hour. There are no restrictions for this procedure.  Your physician has requested that you have a right heart cardiac catheterization- (left groin access) with Dr Ellyn Hack. Cardiac catheterization is used to diagnose and/or treat various heart conditions. Doctors may recommend this procedure for a number of different reasons. The most common reason is to evaluate chest pain. Chest pain can be a symptom of coronary artery disease (CAD), and cardiac catheterization can show whether plaque is narrowing or blocking your heart's arteries. This procedure is also used to evaluate the valves, as well as measure the blood flow and oxygen levels in different parts of your heart. For further information please visit [email or web address]. Please follow instruction sheet, as given.  Needs labs - bmp,pt,ptt,cbc-  No xray needed.  Your physician wants you to follow-up in 2-3 weeks after cath and echo with Dr Ellyn Hack.

## 2015-07-04 NOTE — Assessment & Plan Note (Signed)
We briefly talked about him still smoking. He has deathly cut back, but is still smoking and we talked about how dangerous this is for someone his age.  I think this is the basis for the diagnosis of COPD.

## 2015-07-04 NOTE — Progress Notes (Signed)
Study Date: 06/29/2014 STRESS ECHO  SUMMARY  The patient had chest pain in both stress and recovery.  The patient had 7-8/10 chest pain.  The patient achieved 85 % of maximum predicted heart rate.  The METS achieved was 13.  Exercise capacity was good.  Global LV hypokinesis at rest. EF=40-45%  There was normal increase in global LV function post exercise  The estimated LV ejection fraction is 60-65% with stress.  There were no segmental wall motion abnormalities post exercise  Negative exercise echocardiography for inducible ischemia at target heart   rate.  -  FINDINGS:  -  ECG REST  The baseline ECG displays normal sinus rhythm.  -  ECG STRESS  No diagnostic ST segment changes were seen. T wave flattening noted in leads   V2 and V3 during recovery. Returned to baseline quickly. Negative stress ECG   for inducible ischemia at target heart rate. ECG changes, blood pressure and   heart rate responses during stress test are shown in the Cardiology Scan   portion of this report in the EPIC. Two PAC's noted during exercise.  -  REST ECHO  There is global left ventricular hypokinesis at rest.  -  STRESS ECHO  Normal left ventricular function and global wall motion with stress. There   was normal increase in global LV function post exercise. The estimated LV   ejection fraction is 60-65% with stress. There were no segmental wall motion   abnormalities post exercise. Negative exercise echocardiography for inducible   ischemia at target heart rate.  -  WALL MOTION  Stress Results  Protocol: Wake NormalMaximum Predicted HR: 179 bpm  Target HR: 152 bpm% Maximum Predicted HR: 85 %  StageDuration  (mm:ss) Heart Rate  (bpm) BPComment  12:00 112 135/80Patient began stress test with 1-2/10 chest pain  22:00 125 145/80  32:00 133 155/804/10 chest pain  42:00 142 / 6/10 chest pain  51:16 153 / 7/10 chest pain  1R2:00 96 188/82  22:00  96 158/715/10 chest pain  34:07 90 138/71Chest pain returned to baseline by end of recovery  Stress Duration: 9:16 mm:ss *Recovery Time: 2:00 mm:ss  Maximum Stress HR: 153 bpm *METS: 13  MMode/2D Measurements & Calculations  EDV(MOD-sp4): 108.3 ml  ESV(MOD-sp4): 59.1 ml

## 2015-07-04 NOTE — Assessment & Plan Note (Signed)
His stress echocardiogram failed to show any regional wall motion modalities were to go along with the cardiac catheterization on the 4. Unfortunately, there was no comment about the reduced ejection fraction. Now with his resting dyspnea, we need to exclude a primary pulmonary etiology dyspnea. We will proceed with right heart catheterization per his pulmonologist request.  Plan: Right heart cath via left femoral approach. Will use 7 [place] system to allow for cardiac output, and indexes.
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
## 2015-07-05 ENCOUNTER — Encounter (HOSPITAL_COMMUNITY)
Admission: RE | Disposition: A | Discharge status: Home / Self Care | Payer: Self-pay | Source: Ambulatory Visit | Attending: Cardiology

## 2015-07-05 ENCOUNTER — Encounter (HOSPITAL_COMMUNITY): Payer: Self-pay | Admitting: Cardiology

## 2015-07-05 ENCOUNTER — Ambulatory Visit (HOSPITAL_COMMUNITY)
Admission: RE | Admit: 2015-07-05 | Discharge: 2015-07-05 | Disposition: A | Discharge status: Home / Self Care | Payer: BLUE CROSS/BLUE SHIELD | Source: Ambulatory Visit | Attending: Cardiology | Admitting: Cardiology

## 2015-07-05 DIAGNOSIS — R0609 Other forms of dyspnea: Secondary | ICD-10-CM | POA: Diagnosis not present

## 2015-07-05 DIAGNOSIS — I440 Atrioventricular block, first degree: Secondary | ICD-10-CM | POA: Diagnosis not present

## 2015-07-05 DIAGNOSIS — R931 Abnormal findings on diagnostic imaging of heart and coronary circulation: Secondary | ICD-10-CM | POA: Insufficient documentation

## 2015-07-05 DIAGNOSIS — Z9989 Dependence on other enabling machines and devices: Secondary | ICD-10-CM

## 2015-07-05 DIAGNOSIS — G4733 Obstructive sleep apnea (adult) (pediatric): Secondary | ICD-10-CM | POA: Diagnosis present

## 2015-07-05 DIAGNOSIS — D689 Coagulation defect, unspecified: Secondary | ICD-10-CM

## 2015-07-05 DIAGNOSIS — R079 Chest pain, unspecified: Secondary | ICD-10-CM | POA: Insufficient documentation

## 2015-07-05 DIAGNOSIS — Z01818 Encounter for other preprocedural examination: Secondary | ICD-10-CM

## 2015-07-05 HISTORY — PX: CARDIAC CATHETERIZATION: SHX172

## 2015-07-05 LAB — POCT I-STAT 3, VENOUS BLOOD GAS (G3P V)
Acid-base deficit: 5 mmol/L — ABNORMAL HIGH (ref 0.0–2.0)
Acid-base deficit: 5 mmol/L — ABNORMAL HIGH (ref 0.0–2.0)
Acid-base deficit: 5 mmol/L — ABNORMAL HIGH (ref 0.0–2.0)
BICARBONATE: 20.2 meq/L (ref 20.0–24.0)
Bicarbonate: 20.2 mEq/L (ref 20.0–24.0)
Bicarbonate: 20.6 mEq/L (ref 20.0–24.0)
O2 SAT: 67 %
O2 Saturation: 64 %
O2 Saturation: 71 %
PCO2 VEN: 38.2 mmHg — AB (ref 45.0–50.0)
PCO2 VEN: 38.3 mmHg — AB (ref 45.0–50.0)
PCO2 VEN: 38.7 mmHg — AB (ref 45.0–50.0)
PH VEN: 7.325 — AB (ref 7.250–7.300)
PO2 VEN: 36 mmHg (ref 30.0–45.0)
PO2 VEN: 37 mmHg (ref 30.0–45.0)
TCO2: 22 mmol/L (ref 0–100)
TCO2: 21 mmol/L (ref 0–100)
TCO2: 21 mmol/L (ref 0–100)
pH, Ven: 7.331 — ABNORMAL HIGH (ref 7.250–7.300)
pH, Ven: 7.338 — ABNORMAL HIGH (ref 7.250–7.300)
pO2, Ven: 39 mmHg (ref 30.0–45.0)

## 2015-07-05 LAB — CBC
HCT: 45.6 % (ref 39.0–52.0)
Hemoglobin: 15.6 g/dL (ref 13.0–17.0)
MCH: 32.2 pg (ref 26.0–34.0)
MCHC: 34.2 g/dL (ref 30.0–36.0)
MCV: 94 fL (ref 78.0–100.0)
MPV: 10 fL (ref 8.6–12.4)
PLATELETS: 374 10*3/uL (ref 150–400)
RBC: 4.85 MIL/uL (ref 4.22–5.81)
RDW: 13.8 % (ref 11.5–15.5)
WBC: 9.7 10*3/uL (ref 4.0–10.5)

## 2015-07-05 LAB — BASIC METABOLIC PANEL
BUN: 10 mg/dL (ref 7–25)
CALCIUM: 9.8 mg/dL (ref 8.6–10.3)
CO2: 27 mmol/L (ref 20–31)
CREATININE: 0.98 mg/dL (ref 0.60–1.35)
Chloride: 101 mmol/L (ref 98–110)
Glucose, Bld: 90 mg/dL (ref 65–99)
Potassium: 4.2 mmol/L (ref 3.5–5.3)
Sodium: 136 mmol/L (ref 135–146)

## 2015-07-05 LAB — PROTIME-INR
INR: 0.96 (ref <1.50)
PROTHROMBIN TIME: 12.9 s (ref 11.6–15.2)

## 2015-07-05 LAB — APTT: APTT: 31 s (ref 24–37)

## 2015-07-05 SURGERY — RIGHT HEART CATH

## 2015-07-05 MED ORDER — LIDOCAINE HCL (PF) 1 % IJ SOLN
INTRAMUSCULAR | Status: AC
  Filled 2015-07-05: qty 30

## 2015-07-05 MED ORDER — HEPARIN (PORCINE) IN NACL 2-0.9 UNIT/ML-% IJ SOLN
INTRAMUSCULAR | Status: AC
  Filled 2015-07-05: qty 500

## 2015-07-05 MED ORDER — MIDAZOLAM HCL 2 MG/2ML IJ SOLN
INTRAMUSCULAR | Status: AC
  Filled 2015-07-05: qty 2

## 2015-07-05 MED ORDER — SODIUM CHLORIDE 0.9 % IJ SOLN: 3.0000 mL | INTRAMUSCULAR | Status: DC | PRN

## 2015-07-05 MED ORDER — SODIUM CHLORIDE 0.9 % IJ SOLN: 3.0000 mL | Freq: Two times a day (BID) | INTRAMUSCULAR | Status: DC

## 2015-07-05 MED ORDER — MIDAZOLAM HCL 2 MG/2ML IJ SOLN
INTRAMUSCULAR | Status: DC | PRN
  Administered 2015-07-05: 1 mg via INTRAVENOUS

## 2015-07-05 MED ORDER — HEPARIN (PORCINE) IN NACL 2-0.9 UNIT/ML-% IJ SOLN
INTRAMUSCULAR | Status: DC | PRN
  Administered 2015-07-05: 10:00:00

## 2015-07-05 MED ORDER — ONDANSETRON HCL 4 MG/2ML IJ SOLN: 4.0000 mg | Freq: Four times a day (QID) | INTRAMUSCULAR | Status: DC | PRN

## 2015-07-05 MED ORDER — SODIUM CHLORIDE 0.9 % IV SOLN
INTRAVENOUS | Status: DC
  Administered 2015-07-05: 10:00:00 via INTRAVENOUS

## 2015-07-05 MED ORDER — SODIUM CHLORIDE 0.9 % IV SOLN: 250.0000 mL | INTRAVENOUS | Status: DC | PRN

## 2015-07-05 MED ORDER — ACETAMINOPHEN 325 MG PO TABS: 650.0000 mg | ORAL_TABLET | ORAL | Status: DC | PRN

## 2015-07-05 SURGICAL SUPPLY — 7 items
CATH SWAN GANZ 7F STRAIGHT (CATHETERS) ×2 IMPLANT
KIT HEART RIGHT NAMIC (KITS) ×2 IMPLANT
PACK CARDIAC CATHETERIZATION (CUSTOM PROCEDURE TRAY) ×2 IMPLANT
PROTECTION STATION PRESSURIZED (MISCELLANEOUS) ×3
SHEATH PINNACLE 7F 10CM (SHEATH) ×2 IMPLANT
STATION PROTECTION PRESSURIZED (MISCELLANEOUS) IMPLANT
WIRE EMERALD 3MM-J .025X260CM (WIRE) ×2 IMPLANT

## 2015-07-05 NOTE — Interval H&P Note (Signed)
History and Physical Interval Note:  07/05/2015 9:52 AM  Kevin Ferguson  has presented today for surgery, with the diagnosis of abnormal echo with exertional dyspnea.   The various methods of treatment have been discussed with the patient and family. After consideration of risks, benefits and other options for treatment, the patient has consented to  Procedure(s): Right Heart Cath (N/A) as a surgical intervention .  The patient's history has been reviewed, patient examined, no change in status, stable for surgery.  I have reviewed the patient's chart and labs.  Questions were answered to the patient's satisfaction.     HARDING, DAVID W

## 2015-07-05 NOTE — H&P (View-Only) (Signed)
Study Date: 06/29/2014 STRESS ECHO  SUMMARY  The patient had chest pain in both stress and recovery.  The patient had 7-8/10 chest pain.  The patient achieved 85 % of maximum predicted heart rate.  The METS achieved was 13.  Exercise capacity was good.  Global LV hypokinesis at rest. EF=40-45%  There was normal increase in global LV function post exercise  The estimated LV ejection fraction is 60-65% with stress.  There were no segmental wall motion abnormalities post exercise  Negative exercise echocardiography for inducible ischemia at target heart   rate.  -  FINDINGS:  -  ECG REST  The baseline ECG displays normal sinus rhythm.  -  ECG STRESS  No diagnostic ST segment changes were seen. T wave flattening noted in leads   V2 and V3 during recovery. Returned to baseline quickly. Negative stress ECG   for inducible ischemia at target heart rate. ECG changes, blood pressure and   heart rate responses during stress test are shown in the Cardiology Scan   portion of this report in the EPIC. Two PAC's noted during exercise.  -  REST ECHO  There is global left ventricular hypokinesis at rest.  -  STRESS ECHO  Normal left ventricular function and global wall motion with stress. There   was normal increase in global LV function post exercise. The estimated LV   ejection fraction is 60-65% with stress. There were no segmental wall motion   abnormalities post exercise. Negative exercise echocardiography for inducible   ischemia at target heart rate.  -  WALL MOTION  Stress Results  Protocol: [facility] NormalMaximum Predicted HR: 179 bpm  Target HR: 152 bpm% Maximum Predicted HR: 85 %  StageDuration  (mm:ss) Heart Rate  (bpm) BPComment  12:00 112 135/80Patient began stress test with 1-2/10 chest pain  22:00 125 145/80  32:00 133 155/804/10 chest pain  42:00 142 / 6/10 chest pain  51:16 153 / 7/10 chest pain  1R2:00 96 188/82  22:00  96 158/715/10 chest pain  34:07 90 138/71Chest pain returned to baseline by end of recovery  Stress Duration: 9:16 mm:ss *Recovery Time: 2:00 mm:ss  Maximum Stress HR: 153 bpm *METS: 13  MMode/2D Measurements & Calculations  EDV(MOD-sp4): 108.3 ml  ESV(MOD-sp4): 59.1 ml

## 2015-07-05 NOTE — Progress Notes (Signed)
Site area: Right groin a 7 french venous sheath was removed  Site Prior to Removal:  Level 0  Pressure Applied For 15 MINUTES    Minutes Beginning at 1030am  Manual:   Yes.    Patient Status During Pull:  stable  Post Pull Groin Site:  Level 0  Post Pull Instructions Given:  Yes.    Post Pull Pulses Present:  Yes.    Dressing Applied:  Yes.    Comments:  VS remain stable during sheath pull

## 2015-07-05 NOTE — Discharge Instructions (Signed)
Angiogram, Care After °Refer to this sheet in the next few weeks. These instructions provide you with information about caring for yourself after your procedure. Your health care provider may also give you more specific instructions. Your treatment has been planned according to current medical practices, but problems sometimes occur. Call your health care provider if you have any problems or questions after your procedure. °WHAT TO EXPECT AFTER THE PROCEDURE °After your procedure, it is typical to have the following: °· Bruising at the catheter insertion site that usually fades within 1-2 weeks. °· Blood collecting in the tissue (hematoma) that may be painful to the touch. It should usually decrease in size and tenderness within 1-2 weeks. °HOME CARE INSTRUCTIONS °· Take medicines only as directed by your health care provider. °· You may shower 24-48 hours after the procedure or as directed by your health care provider. Remove the bandage (dressing) and gently wash the site with plain soap and water. Pat the area dry with a clean towel. Do not rub the site, because this may cause bleeding. °· Do not take baths, swim, or use a hot tub until your health care provider approves. °· Check your insertion site every day for redness, swelling, or drainage. °· Do not apply powder or lotion to the site. °· Do not lift over 10 lb (4.5 kg) for 5 days after your procedure or as directed by your health care provider. °· Ask your health care provider when it is okay to: °¨ Return to work or school. °¨ Resume usual physical activities or sports. °¨ Resume sexual activity. °· Do not drive home if you are discharged the same day as the procedure. Have someone else drive you. °· You may drive 24 hours after the procedure unless otherwise instructed by your health care provider. °· Do not operate machinery or power tools for 24 hours after the procedure or as directed by your health care provider. °· If your procedure was done as an  outpatient procedure, which means that you went home the same day as your procedure, a responsible adult should be with you for the first 24 hours after you arrive home. °· Keep all follow-up visits as directed by your health care provider. This is important. °SEEK MEDICAL CARE IF: °· You have a fever. °· You have chills. °· You have increased bleeding from the catheter insertion site. Hold pressure on the site. °SEEK IMMEDIATE MEDICAL CARE IF: °· You have unusual pain at the catheter insertion site. °· You have redness, warmth, or swelling at the catheter insertion site. °· You have drainage (other than a small amount of blood on the dressing) from the catheter insertion site. °· The catheter insertion site is bleeding, and the bleeding does not stop after 30 minutes of holding steady pressure on the site. °· The area near or just beyond the catheter insertion site becomes pale, cool, tingly, or numb. °  °This information is not intended to replace advice given to you by your health care provider. Make sure you discuss any questions you have with your health care provider. °  °Document Released: 12/19/2004 Document Revised: 06/23/2014 Document Reviewed: 11/03/2012 °Elsevier Interactive Patient Education ©2016 [employer] Inc[employer] ° °

## 2015-07-13 ENCOUNTER — Other Ambulatory Visit: Payer: Self-pay

## 2015-07-13 ENCOUNTER — Ambulatory Visit (HOSPITAL_COMMUNITY): Payer: BLUE CROSS/BLUE SHIELD | Attending: Cardiology

## 2015-07-13 DIAGNOSIS — Z01818 Encounter for other preprocedural examination: Secondary | ICD-10-CM | POA: Diagnosis not present

## 2015-07-13 DIAGNOSIS — Z87891 Personal history of nicotine dependence: Secondary | ICD-10-CM | POA: Diagnosis not present

## 2015-07-13 DIAGNOSIS — D689 Coagulation defect, unspecified: Secondary | ICD-10-CM | POA: Diagnosis not present

## 2015-07-13 DIAGNOSIS — R9431 Abnormal electrocardiogram [ECG] [EKG]: Secondary | ICD-10-CM | POA: Insufficient documentation

## 2015-07-13 DIAGNOSIS — R931 Abnormal findings on diagnostic imaging of heart and coronary circulation: Secondary | ICD-10-CM

## 2015-07-13 DIAGNOSIS — R0609 Other forms of dyspnea: Secondary | ICD-10-CM | POA: Diagnosis not present

## 2015-07-13 DIAGNOSIS — R0600 Dyspnea, unspecified: Secondary | ICD-10-CM | POA: Diagnosis present

## 2015-07-17 ENCOUNTER — Telehealth: Payer: Self-pay | Admitting: *Deleted

## 2015-07-17 ENCOUNTER — Encounter: Payer: Self-pay | Admitting: Cardiology

## 2015-07-17 NOTE — Telephone Encounter (Signed)
Spoke to patient. Result given . Verbalized understanding\ ROUTED  Lucita Lora NP

## 2015-07-17 NOTE — Telephone Encounter (Signed)
-----   Message from Leonie Man, MD sent at 07/16/2015 10:29 PM EST ----- Good news.  Pretty normal looking Echo. Normal wall motion & valves. Also concurs with Cath results for pressures inside the heart.  Hard to put the shortness of breath on a Heart condition.  HARDING, DAVID Viona Gilmore, MD   pls forward to [name], NP

## 2015-08-28 ENCOUNTER — Encounter: Payer: Self-pay | Admitting: Internal Medicine

## 2015-08-28 DIAGNOSIS — R0609 Other forms of dyspnea: Principal | ICD-10-CM

## 2015-08-28 DIAGNOSIS — J849 Interstitial pulmonary disease, unspecified: Secondary | ICD-10-CM

## 2015-08-28 NOTE — Telephone Encounter (Signed)
MyChart Message:  Have not heard anything since my right heart Cath. I'm still having the same problem breathing so what is my next move? Please let me know.  Thanks Jeneen Rinks    Dr. Chase Caller, please advise.

## 2015-08-29 NOTE — Telephone Encounter (Signed)
I recommend [facility] for 2nd opinion. I have tried my best and unable to figure out.

## 2015-08-30 NOTE — Telephone Encounter (Signed)
Pt prefer [facility]. Please advise MR thanks

## 2015-09-04 ENCOUNTER — Telehealth: Payer: Self-pay | Admitting: Internal Medicine

## 2015-09-04 NOTE — Telephone Encounter (Signed)
Ok refer to [facility]

## 2015-09-05 NOTE — Telephone Encounter (Signed)
Referral already made and PPC's are working on this

## 2015-09-18 ENCOUNTER — Ambulatory Visit (INDEPENDENT_AMBULATORY_CARE_PROVIDER_SITE_OTHER): Payer: BLUE CROSS/BLUE SHIELD | Admitting: Pulmonary Disease

## 2015-09-18 ENCOUNTER — Encounter: Payer: Self-pay | Admitting: Pulmonary Disease

## 2015-09-18 VITALS — BP 122/80 | HR 96 | Ht 70.0 in | Wt 202.6 lb

## 2015-09-18 DIAGNOSIS — G4733 Obstructive sleep apnea (adult) (pediatric): Secondary | ICD-10-CM | POA: Diagnosis not present

## 2015-09-18 DIAGNOSIS — Z9989 Dependence on other enabling machines and devices: Principal | ICD-10-CM

## 2015-09-18 NOTE — Progress Notes (Signed)
Current Outpatient Prescriptions on File Prior to Visit  Medication Sig  . acetaminophen (TYLENOL) 325 MG tablet Take 650 mg by mouth every 6 (six) hours as needed for mild pain or moderate pain.  Marland Kitchen ALPRAZolam (XANAX) 0.25 MG tablet Take 0.25 mg by mouth daily as needed for anxiety.   Marland Kitchen atorvastatin (LIPITOR) 20 MG tablet Take 20 mg by mouth every evening.   Marland Kitchen esomeprazole (NEXIUM) 40 MG capsule Take 40 mg by mouth 2 (two) times daily before a meal.  . guaiFENesin (MUCINEX) 600 MG 12 hr tablet Take 600 mg by mouth 2 (two) times daily as needed for cough.  Marland Kitchen ibuprofen (ADVIL,MOTRIN) 200 MG tablet Take 400 mg by mouth every 6 (six) hours as needed for mild pain.  Marland Kitchen levalbuterol (XOPENEX HFA) 45 MCG/ACT inhaler Inhale 2 puffs into the lungs every 6 (six) hours as needed for shortness of breath.  . meclizine (ANTIVERT) 25 MG tablet Take 25 mg by mouth at bedtime.   . mometasone-formoterol (DULERA) 100-5 MCG/ACT AERO Take 2 puffs first thing in am and then another 2 puffs about 12 hours later.  . montelukast (SINGULAIR) 10 MG tablet Take 10 mg by mouth at bedtime.  . nitroGLYCERIN (NITROSTAT) 0.3 MG SL tablet Place 0.3 mg under the tongue as needed.  . NON FORMULARY cpap nightly  . PARoxetine HCl (PAXIL PO) Take 60 mg by mouth every evening. Takes one 20 mg tab and one 40 mg tab = 60 mg  . pregabalin (LYRICA) 150 MG capsule Take 150 mg by mouth 2 (two) times daily.   Marland Kitchen rOPINIRole (REQUIP) 2 MG tablet Take 2 mg by mouth at bedtime.   Marland Kitchen testosterone cypionate (DEPOTESTOSTERONE CYPIONATE) 200 MG/ML injection Inject into the muscle every 28 (twenty-eight) days. Every month  . topiramate (TOPAMAX) 50 MG tablet Take 75 mg by mouth 2 (two) times daily.    No current facility-administered medications on file prior to visit.    Chief Complaint  Patient presents with  . Follow-up    pt wears CPAP 5 hr everynight. feels pressure is good. no supplies needed. [id number]    Tests PSG 09/03/11 >> 6.9, SpO2  low 89%. CPAP 11/09/14 to 02/06/15 >> used on 43 of 90 nights with average 5 hrs and 26 min.  Average AHI is 1.7 with CPAP 14 cm H2O. HRCT 01/15/15 >> RB-ILD pattern PFT 01/30/15 >> FEV1 3.16 (76%), FEV1% 89, TLC 5.37 (77%), DLCO 65% ONO with CPAP and RA 02/08/15 >> test time 8 hrs 23 min. Basal SpO2 94.4%, low SpO2 51% (artifact). Spent 2.8 min with SpO2 < 88%  Past medical history Fibromyalgia, Anxiety, GERD, HA, Depression, HLD, CAD, RLS  Past surgical history, allergies, family history, social history all reviewed  Vital signs BP 122/80 mmHg  Pulse 96  Ht 5\' 10"  (1.778 m)  Wt 202 lb 9.6 oz (91.899 kg)  BMI 29.07 kg/m2  SpO2 91%  History of Present Illness: Kevin Ferguson is a 45 y.o. male with OSA  He has been doing better with CPAP since pressure was decreased.  He gets about 6 to 7 hrs sleep per night.  No issues with mask fit.  He sometimes naps for about 30 minutes.  Physical Exam:  General - No distress ENT - No sinus tenderness, no oral exudate, no LAN Cardiac - s1s2 regular, no murmur Chest - No wheeze/rales/dullness Back - No focal tenderness Abd - Soft, non-tender Ext - No edema Neuro - Normal strength Skin - No  rashes Psych - Normal mood, and behavior  Assessment/plan:  Obstructive sleep apnea. Plan: - continue CPAP 12 cm H2O   Patient Instructions  Follow up in 1 year     Chesley Mires, M.D. Pager [phone]  09/18/2015

## 2015-09-18 NOTE — Patient Instructions (Signed)
Follow up in 1 year.

## 2015-12-27 ENCOUNTER — Telehealth: Payer: Self-pay | Admitting: Cardiology

## 2015-12-27 NOTE — Telephone Encounter (Signed)
Spoke with patient regarding chest pains Patient stated he has been having sharp chest pain in the middle of chest and to the left on and off for about 2 weeks lasting about 15-20 seconds Chest pain is worse with exertion, denies any radiating pain, nausea, or diaphoresis. Per patient his pulmonologist D/C his inhalers about 2 weeks ago secondary interactions with heart, CP started afterwards Patient denies any change in his shortness of breath Stated was told to call back by Dr Ellyn Hack if he had any further chest pains Did receive left heart cath from [facility] done 01/09/16, normal coronary arteries Will forward to Dr Ellyn Hack for review

## 2015-12-27 NOTE — Telephone Encounter (Signed)
Pt c/o of Chest Pain: STAT if CP now or developed within 24 hours  1. Are you having CP right now? No   2. Are you experiencing any other symptoms (ex. SOB, nausea, vomiting, sweating)? no  3. How long have you been experiencing CP? About week   4. Is your CP continuous or coming and going? Comes and goes   5. Have you taken Nitroglycerin? no ?

## 2015-12-28 NOTE — Telephone Encounter (Signed)
Not really sure what further workup to do b/c the cath was normal. We could repeat a ST.  Glenetta Hew, MD

## 2015-12-28 NOTE — Telephone Encounter (Signed)
Left heart cath done 01/09/2012 NOT 2017 Patient did think his cath 2017 was right and left heart cath Unsure of ST will forward to Dr Ellyn Hack to see if he would like myoview or ETT

## 2015-12-31 NOTE — Telephone Encounter (Signed)
It's funny. I saw His mom last Friday. I saw him in the Consultation room.  I think probably should have come in to be seen either by me or a PA/NP intermittent determine if we need to order a stress test.  Glenetta Hew, MD

## 2015-12-31 NOTE — Telephone Encounter (Signed)
Scheduled appointment next week with Allie Bossier PA  Advised patient, verbalized understanding

## 2016-01-09 ENCOUNTER — Ambulatory Visit (INDEPENDENT_AMBULATORY_CARE_PROVIDER_SITE_OTHER): Payer: BLUE CROSS/BLUE SHIELD | Admitting: Nurse Practitioner

## 2016-01-09 ENCOUNTER — Encounter: Payer: Self-pay | Admitting: Nurse Practitioner

## 2016-01-09 VITALS — BP 104/72 | HR 68 | Ht 70.0 in | Wt 202.8 lb

## 2016-01-09 DIAGNOSIS — I2510 Atherosclerotic heart disease of native coronary artery without angina pectoris: Secondary | ICD-10-CM | POA: Diagnosis not present

## 2016-01-09 DIAGNOSIS — R0789 Other chest pain: Secondary | ICD-10-CM

## 2016-01-09 DIAGNOSIS — R0609 Other forms of dyspnea: Secondary | ICD-10-CM

## 2016-01-09 DIAGNOSIS — Z720 Tobacco use: Secondary | ICD-10-CM | POA: Insufficient documentation

## 2016-01-09 NOTE — Patient Instructions (Signed)
Chris Berge, NP, recommends that you schedule a follow-up appointment in 6 months with Dr Harding. You will receive a reminder letter in the mail two months in advance. If you don't receive a letter, please call our office to schedule the follow-up appointment.  If you need a refill on your cardiac medications before your next appointment, please call your pharmacy. 
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
## 2016-10-16 IMAGING — CT CT CHEST HIGH RESOLUTION W/O CM
2 of 6 series · 12 of 36 positions shown, 15 images · non-contrast
Comparison: Chest CT 04/27/2011.

CLINICAL DATA: 44-year-old male with worsening shortness of breath
on exertion for the past 1-1/2 years.

EXAM:
CT CHEST WITHOUT CONTRAST
TECHNIQUE: Multidetector CT imaging of the chest was performed following the
standard protocol without intravenous contrast. High resolution
imaging of the lungs, as well as inspiratory and expiratory imaging,
was performed.

[Series 5: lung · axial · 0.67mm/px · z∈[-259,-54]mm · 9 of 53 slices shown, 12 images]
[im 6/53  mediastinal]
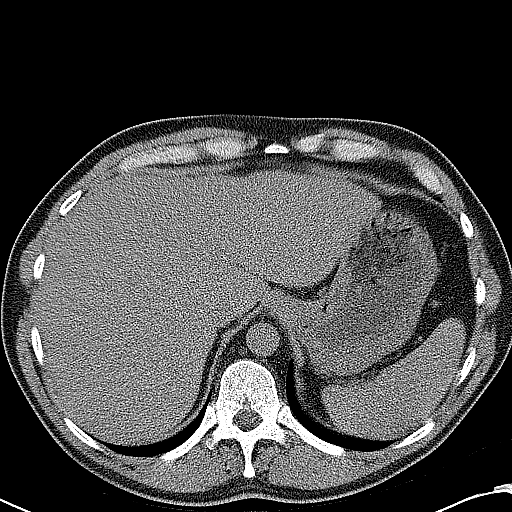
[im 6/53  lung]
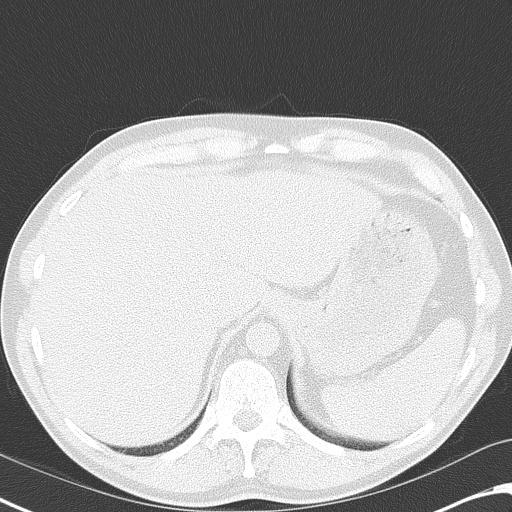
[im 11/53  lung]
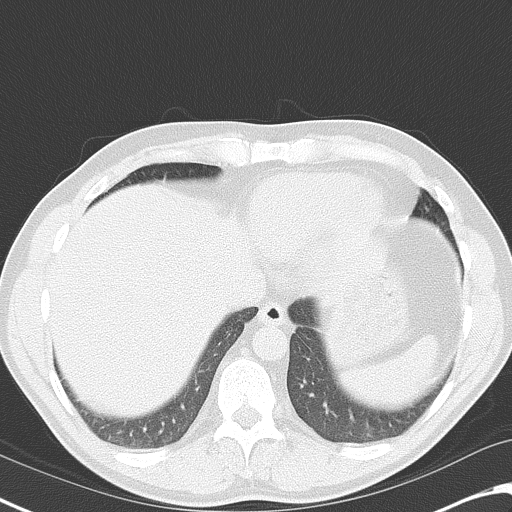
[im 16/53  lung]
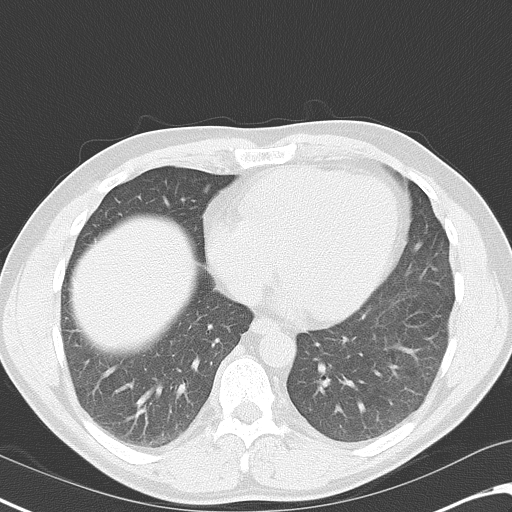
[im 21/53  lung]
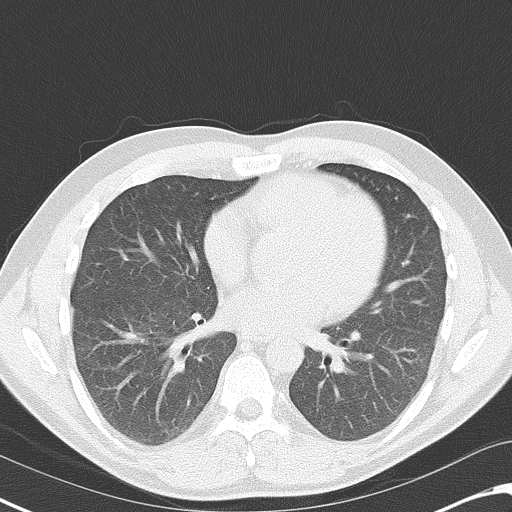
[im 27/53  mediastinal]
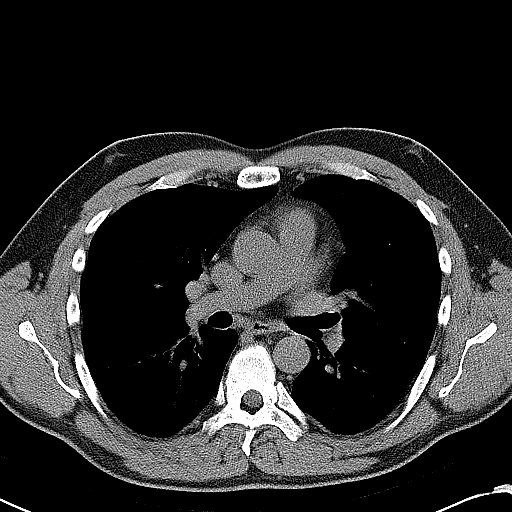
[im 27/53  lung]
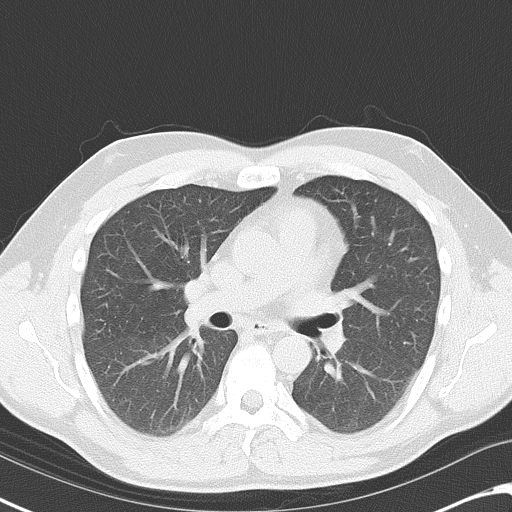
[im 32/53  lung]
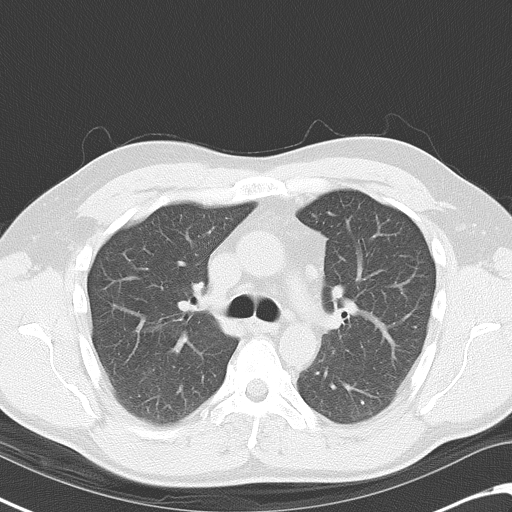
[im 37/53  lung]
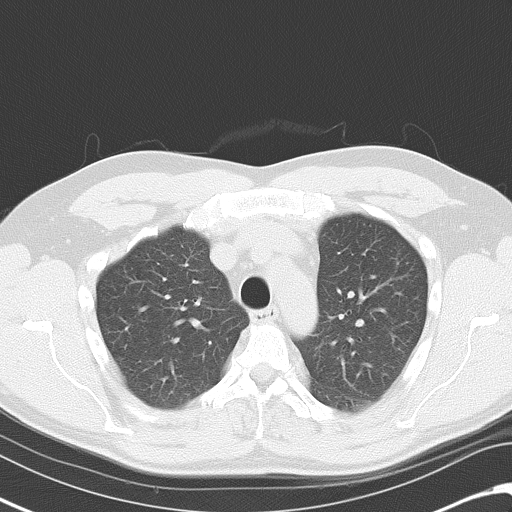
[im 42/53  lung]
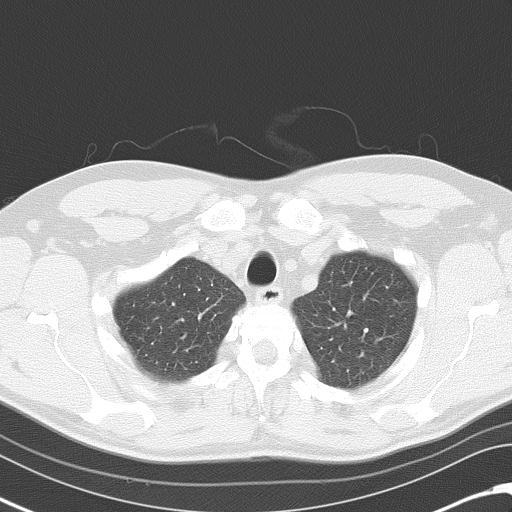
[im 47/53  mediastinal]
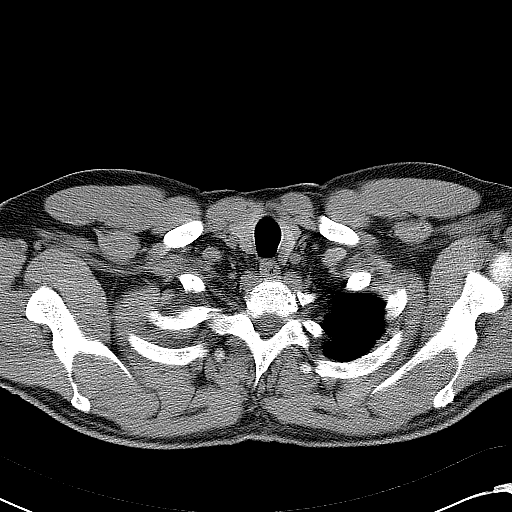
[im 47/53  lung]
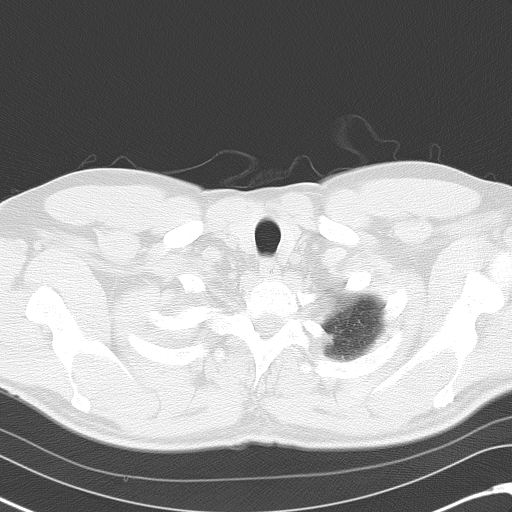

[Series 602: cor · coronal · 0.67mm/px · 3 of 111 slices shown]
[im 23/111  lung]
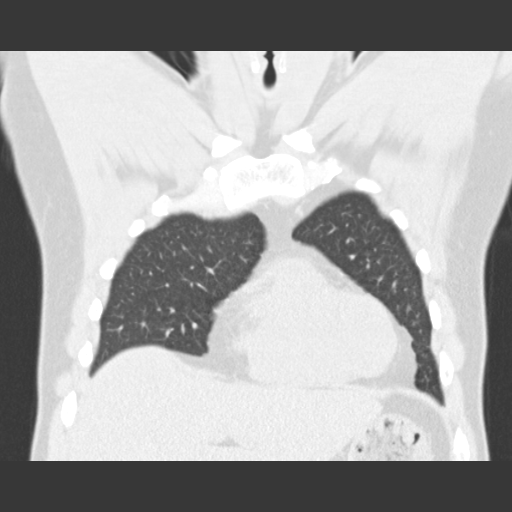
[im 45/111  lung]
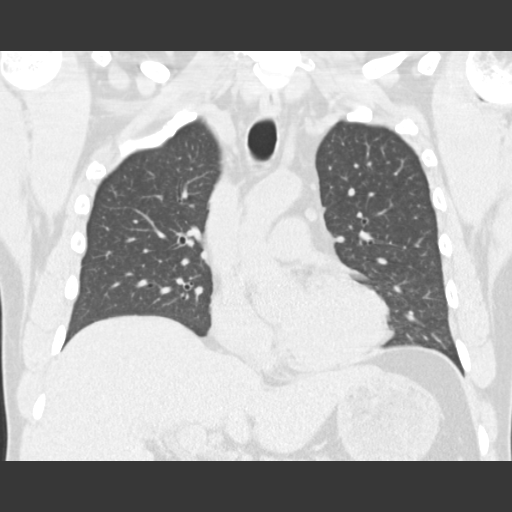
[im 67/111  lung]
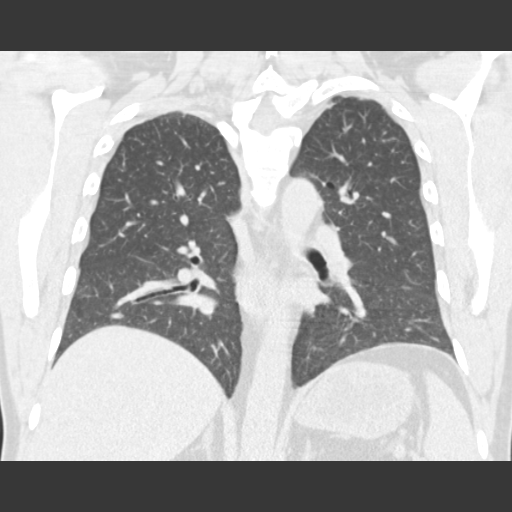

[12 of 36 positions shown; findings below may reference images not displayed]

FINDINGS: Mediastinum/Lymph Nodes: Heart size is normal. There is no
significant pericardial fluid, thickening or pericardial
calcification. No pathologically enlarged mediastinal or hilar lymph
nodes. Please note that accurate exclusion of hilar adenopathy is
limited on noncontrast CT scans. Esophagus is unremarkable in
appearance. No axillary lymphadenopathy.

Lungs/Pleura: High-resolution images demonstrate diffuse
centrilobular ground-glass attenuation micro nodularity, likely from
a smoking related disease such as respiratory bronchiolitis, or
respiratory bronchiolitis interstitial lung disease. There are
otherwise no significant regions of generalized ground-glass
attenuation, subpleural reticulation, parenchymal banding, traction
bronchiectasis or frank honeycombing. Inspiratory and expiratory
imaging demonstrates some mild air trapping, indicative of mild
small airways disease. No confluent consolidative airspace disease.
No pleural effusions. No larger more suspicious appearing pulmonary
nodules or masses are otherwise noted.

Upper Abdomen: Unremarkable.

Musculoskeletal/Soft Tissues: There are no aggressive appearing
lytic or blastic lesions noted in the visualized portions of the
skeleton.
IMPRESSION: 1. The appearance of the lungs is most suggestive of a smoking
related disease such as respiratory bronchiolitis ([place]), or more
likely given the patient's symptomatology, respiratory bronchiolitis
interstitial lung disease ([place]). Counseling for smoking cessation
is strongly recommended.

## 2016-10-22 ENCOUNTER — Ambulatory Visit (INDEPENDENT_AMBULATORY_CARE_PROVIDER_SITE_OTHER): Payer: BLUE CROSS/BLUE SHIELD | Admitting: Internal Medicine

## 2016-10-22 ENCOUNTER — Encounter: Payer: Self-pay | Admitting: Internal Medicine

## 2016-10-22 DIAGNOSIS — R0609 Other forms of dyspnea: Secondary | ICD-10-CM | POA: Diagnosis not present

## 2016-10-22 LAB — NITRIC OXIDE: Nitric Oxide: 15

## 2016-10-22 NOTE — Addendum Note (Signed)
Addended by: Collier Salina on: 10/22/2016 12:27 PM   Modules accepted: Orders
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
## 2016-10-22 NOTE — Assessment & Plan Note (Addendum)
Remains unexplained Asthma test was normal  Plan Reinitiate all workup Do PFT (full) dO HRCT  Followup Return to see me after above

## 2016-10-22 NOTE — Patient Instructions (Addendum)
ICD-9-CM ICD-10-CM   1. DOE (dyspnea on exertion) 786.09 R06.09     DOE (dyspnea on exertion) Remains unexplained Asthma test was normal  Plan Reinitiate all workup Do PFT (full) dO HRCT  Followup Return to see me after above

## 2016-10-22 NOTE — Addendum Note (Signed)
Addended by: Collier Salina on: 10/22/2016 12:16 PM   Modules accepted: Orders

## 2016-10-27 ENCOUNTER — Ambulatory Visit (INDEPENDENT_AMBULATORY_CARE_PROVIDER_SITE_OTHER): Payer: BLUE CROSS/BLUE SHIELD | Admitting: Internal Medicine

## 2016-10-27 DIAGNOSIS — R0609 Other forms of dyspnea: Secondary | ICD-10-CM

## 2016-10-27 NOTE — Progress Notes (Signed)
PFT completed today.Katie Welchel,CMA  

## 2016-10-29 ENCOUNTER — Ambulatory Visit (INDEPENDENT_AMBULATORY_CARE_PROVIDER_SITE_OTHER)
Admission: RE | Admit: 2016-10-29 | Discharge: 2016-10-29 | Disposition: A | Discharge status: Home / Self Care | Payer: BLUE CROSS/BLUE SHIELD | Source: Ambulatory Visit | Attending: Internal Medicine | Admitting: Internal Medicine

## 2016-10-29 DIAGNOSIS — R0609 Other forms of dyspnea: Secondary | ICD-10-CM | POA: Diagnosis not present

## 2016-11-07 ENCOUNTER — Telehealth: Payer: Self-pay | Admitting: Internal Medicine

## 2016-11-07 NOTE — Telephone Encounter (Signed)
Called spoke with patient, advised of CT results/recs as stated by MR Pt voiced his understanding  Pt is requesting his PFT results from 5.14.18 MR please advise, thank you

## 2016-11-07 NOTE — Telephone Encounter (Signed)
Ct chest without ild but mild emphysema. Will discuss 7/25/187 visit  Dr. Brand Males, M.D., [facility].C.P Pulmonary and Critical Care Medicine Staff Physician [place] [facility] Pager: [phone], If no answer or between  15:00h - 7:00h: call [phone]  11/07/2016 2:39 PM

## 2016-11-11 NOTE — Telephone Encounter (Signed)
I do not see any PFT tht has flown into our system  Dr. Brand Males, M.D., [facility].C.P Pulmonary and Critical Care Medicine Staff Physician [place] [facility] Pager: [phone], If no answer or between  15:00h - 7:00h: call [phone]  11/11/2016 5:19 PM

## 2016-11-11 NOTE — Telephone Encounter (Signed)
Kevin Ferguson, can you print the PFT off and give to MR to review? I do not know why it's not flowing over

## 2016-11-20 NOTE — Telephone Encounter (Signed)
This has been printed and will be placed in MR's look at folder for his review.

## 2016-12-11 NOTE — Telephone Encounter (Signed)
PFT results have been placed in the front of MR's look-at folder. Please advise so that we can provide him with his results and close this message. Thanks.

## 2017-01-07 ENCOUNTER — Encounter: Payer: Self-pay | Admitting: Internal Medicine

## 2017-01-07 ENCOUNTER — Ambulatory Visit (INDEPENDENT_AMBULATORY_CARE_PROVIDER_SITE_OTHER): Payer: BLUE CROSS/BLUE SHIELD | Admitting: Internal Medicine

## 2017-01-07 VITALS — BP 110/84 | HR 83 | Ht 70.0 in | Wt 191.8 lb

## 2017-01-07 DIAGNOSIS — J439 Emphysema, unspecified: Secondary | ICD-10-CM

## 2017-01-07 DIAGNOSIS — R942 Abnormal results of pulmonary function studies: Secondary | ICD-10-CM | POA: Diagnosis not present

## 2017-01-07 DIAGNOSIS — R0609 Other forms of dyspnea: Secondary | ICD-10-CM

## 2017-01-07 LAB — PULMONARY FUNCTION TEST
DL/VA % pred: 77 %
DL/VA: 3.51 ml/min/mmHg/L
DLCO cor % pred: 92 %
DLCO cor: 27.47 ml/min/mmHg
DLCO unc % pred: 94 %
DLCO unc: 28.14 ml/min/mmHg
FEF 25-75 Post: 3.1 L/sec
FEF 25-75 Pre: 3.57 L/sec
FEF2575-%Change-Post: -13 %
FEF2575-%PRED-PRE: 99 %
FEF2575-%Pred-Post: 86 %
FEV1-%Change-Post: -3 %
FEV1-%PRED-PRE: 79 %
FEV1-%Pred-Post: 76 %
FEV1-PRE: 3.07 L
FEV1-Post: 2.95 L
FEV1FVC-%CHANGE-POST: -3 %
FEV1FVC-%Pred-Pre: 106 %
FEV6-%CHANGE-POST: 1 %
FEV6-%PRED-PRE: 75 %
FEV6-%Pred-Post: 76 %
FEV6-PRE: 3.59 L
FEV6-Post: 3.63 L
FEV6FVC-%PRED-PRE: 103 %
FEV6FVC-%Pred-Post: 103 %
FVC-%CHANGE-POST: 0 %
FVC-%PRED-POST: 74 %
FVC-%Pred-Pre: 74 %
FVC-POST: 3.63 L
FVC-Pre: 3.65 L
POST FEV6/FVC RATIO: 100 %
Post FEV1/FVC ratio: 81 %
Pre FEV1/FVC ratio: 84 %
Pre FEV6/FVC Ratio: 100 %
RV % PRED: 73 %
RV: 1.33 L
TLC % PRED: 73 %
TLC: 4.81 L
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
## 2017-01-07 NOTE — Patient Instructions (Signed)
ICD-10-CM   1. DOE (dyspnea on exertion) R06.09   2. Pulmonary emphysema, unspecified emphysema type ([place]) J43.9   3. Abnormal flow volume loop R94.2    You have very mild emphysema seen on CT Not sure how much of shortness of breath is attributed to this You can try samples of stiolto or anoro daily  In addition, Breathing test suggests that he might have abnormal vocal cord movement We will review to ENT to make sure there is no pathology on the vocal cords If ENT exam is normal it is very likely that you have functional vocal cord movement problems We can then address this at follow-up  Return to see me in 2-3 months but after ENT visitu

## 2017-01-07 NOTE — Addendum Note (Signed)
Addended by: Collier Salina on: 01/07/2017 09:43 AM   Modules accepted: Orders

## 2017-01-09 NOTE — Telephone Encounter (Signed)
He has been seen since the call. CLosing msg  Dr. Brand Males, M.D., [facility].C.P Pulmonary and Critical Care Medicine Staff Physician [place] [facility] Pager: [phone], If no answer or between  15:00h - 7:00h: call [phone]  01/09/2017 4:06 PM

## 2017-02-12 ENCOUNTER — Ambulatory Visit: Payer: BLUE CROSS/BLUE SHIELD | Attending: Otolaryngology | Admitting: Speech Pathology

## 2017-02-12 DIAGNOSIS — R498 Other voice and resonance disorders: Secondary | ICD-10-CM

## 2017-02-12 DIAGNOSIS — J383 Other diseases of vocal cords: Secondary | ICD-10-CM

## 2017-02-12 NOTE — Patient Instructions (Signed)
Your signs and symptoms may be consistent with esophageal dysphagia. Only your doctor can diagnose esophageal dysphagia, please discuss with your physician.  What is reflux? Gastroesophageal Reflux Disease (GERD) commonly referred to as reflux, is a backflow of acid from the stomach into the swallowing tube or esophagus.  Some reflux is normal, but when it happens frequently, the acid can irritate and damage the lining on the inside of the esophagus.  The most common symptom is heartburn.  Laryngopharyngeal Reflux (LPR) is when the acid backflow reaches the throat.  The structures of the throat (pharynx, larynx) are much more sensitive to stomach acid, so there is increased risk of damage.  People with LPR often do not experience heartburn.  The more common symptoms of LPR include: hoarseness, chronic cough, frequent throat clearing, feeling a lump in the throat and problems swallowing.  There are many changes you can make in diet, positioning and in your lifestyle that can have a dramatic effect in preventing or stopping reflux.  They include:  Everyday: Avoid tight or constricting clothing Avoid smoking, or exposing yourself to second hand smoke Avoid non-steroidal anti-inflammatory drugs (ibuprofen, Alleve) Exercise regularly, reduce stress Lose weight   Avoiding or limiting certain foods: Spicy, acidic (tomato-based foods, citrus or vinegar-based foods) Fruit juices such as orange, grapefruit or cranberry Fried foods Caffeine (such as coffee, tea, sodas)   Carbonated beverages  Chocolate  Peppermint Alcohol Decrease Dairy Decrease red meat Any food that gives you symptoms      During and after meals: Eat slowly and don't overeat at meals Eat several smaller meals a day, rather than larger ones Do not lie down for at least  hour- 1 hour after meals Avoid bending over or exercising after eating Chewing gum (non-mint) for 20 minutes after each meal may be helpful Drinking warm  fluids with meals (i.e. warm decaffeinated tea) may help clear the esophagus better  Bedtime: Avoid eating or drinking within 2-3 hours before bedtime, except for water Elevate the head of the bed 6-8 inches with blocks, books, or wedge under your mattress (propping  yourself up on pillows may cause neck or back pain) If you take medications at night, be sure to take them with a full glass of water  
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
## 2017-02-19 ENCOUNTER — Ambulatory Visit: Payer: BLUE CROSS/BLUE SHIELD | Attending: Otolaryngology | Admitting: Speech Pathology

## 2017-02-19 DIAGNOSIS — R498 Other voice and resonance disorders: Secondary | ICD-10-CM | POA: Diagnosis present

## 2017-02-19 DIAGNOSIS — J383 Other diseases of vocal cords: Secondary | ICD-10-CM | POA: Diagnosis present
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
## 2017-02-23 ENCOUNTER — Encounter: Payer: Self-pay | Admitting: Speech Pathology

## 2017-02-23 ENCOUNTER — Ambulatory Visit: Payer: BLUE CROSS/BLUE SHIELD | Admitting: Speech Pathology

## 2017-02-23 NOTE — Therapy (Signed)
SPEECH THERAPY DISCHARGE SUMMARY  Visits from Start of Care: 2  Current functional level related to goals / functional outcomes: Pt seen for one treatment session only; was progressing toward goals for vocal hygiene, AB.   Remaining deficits: Pt demonstrates reduced breath support for speech, impaired vocal function impacting vocal endurance.   Education / Equipment: None recommended Plan: Patient agrees to discharge.  Patient goals were not met. Patient is being discharged due to financial reasons.  ?????     [facility] [address] [place], [place], [address] Phone: [phone]   Fax:  [phone]  Patient Details  Name: Kevin Ferguson MRN: [id number] Date of Birth: 1970-09-02 Referring Provider:  No ref. provider found  Encounter Date: 02/23/2017  Deneise Lever, [place], [place] 02/23/2017, 9:38 AM  [facility] [address] [place], [place], [address] Phone: [phone]   Fax:  [phone]

## 2017-02-26 ENCOUNTER — Encounter: Payer: BLUE CROSS/BLUE SHIELD | Admitting: Speech Pathology

## 2017-03-02 ENCOUNTER — Encounter: Payer: BLUE CROSS/BLUE SHIELD | Admitting: Speech Pathology

## 2017-03-09 ENCOUNTER — Encounter: Payer: BLUE CROSS/BLUE SHIELD | Admitting: Speech Pathology

## 2017-03-12 ENCOUNTER — Encounter: Payer: BLUE CROSS/BLUE SHIELD | Admitting: Speech Pathology

## 2017-03-16 ENCOUNTER — Other Ambulatory Visit (INDEPENDENT_AMBULATORY_CARE_PROVIDER_SITE_OTHER): Payer: BLUE CROSS/BLUE SHIELD

## 2017-03-16 ENCOUNTER — Ambulatory Visit (INDEPENDENT_AMBULATORY_CARE_PROVIDER_SITE_OTHER): Payer: BLUE CROSS/BLUE SHIELD | Admitting: Internal Medicine

## 2017-03-16 ENCOUNTER — Encounter: Payer: Self-pay | Admitting: Internal Medicine

## 2017-03-16 VITALS — BP 122/80 | HR 72 | Ht 70.0 in | Wt 193.6 lb

## 2017-03-16 DIAGNOSIS — J439 Emphysema, unspecified: Secondary | ICD-10-CM

## 2017-03-16 DIAGNOSIS — R0609 Other forms of dyspnea: Secondary | ICD-10-CM

## 2017-03-16 LAB — BASIC METABOLIC PANEL
BUN: 11 mg/dL (ref 6–23)
CO2: 24 mEq/L (ref 19–32)
Calcium: 9.3 mg/dL (ref 8.4–10.5)
Chloride: 107 mEq/L (ref 96–112)
Creatinine, Ser: 0.94 mg/dL (ref 0.40–1.50)
GFR: 92.57 mL/min (ref >60.00)
Glucose, Bld: 95 mg/dL (ref 70–99)
POTASSIUM: 3.7 meq/L (ref 3.5–5.1)
SODIUM: 140 meq/L (ref 135–145)

## 2017-03-16 MED ORDER — TIOTROPIUM BROMIDE MONOHYDRATE 1.25 MCG/ACT IN AERS: 2.0000 | INHALATION_SPRAY | Freq: Every day | RESPIRATORY_TRACT | 0 refills | Status: DC
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
## 2017-03-16 NOTE — Patient Instructions (Addendum)
ICD-10-CM   1. DOE (dyspnea on exertion) R06.09   2. Pulmonary emphysema, unspecified emphysema type ([place]) J43.9     Remains unexplained - shortness of breath Mild emphysema on CT - not sure what to make of it  Plan - agree with vocal cord exercises and seeing GI for GERD  - flu shot with pcp [name], NP - try samples (4-8 weeks of worth) - of an inhaled anticholinergic (spiriva, tudorza or incruse) - daily  - if improvement call us back =- quit smoking - check bmet blood work to see if topirmaate can be contributing to shortness of breath  Followup  - support not going to [facility] for 3rd opininn - ROV 9 months or sooner with dR Dava Rensch  - CPST bike test (serial) if shortness of breath still a problem

## 2017-03-17 ENCOUNTER — Telehealth: Payer: Self-pay | Admitting: Internal Medicine

## 2017-03-17 NOTE — Telephone Encounter (Signed)
Spoke with patient. He is aware of results. Nothing else needed at time of call.  

## 2017-03-17 NOTE — Telephone Encounter (Signed)
Bicarb normal. So is not the topamax making him dyspneic   Recent Labs Lab 03/16/17 1006  NA 140  K 3.7  CL 107  CO2 24  GLUCOSE 95  BUN 11  CREATININE 0.94  CALCIUM 9.3

## 2017-03-19 ENCOUNTER — Encounter: Payer: BLUE CROSS/BLUE SHIELD | Admitting: Speech Pathology

## 2017-03-24 ENCOUNTER — Ambulatory Visit (HOSPITAL_COMMUNITY): Payer: BLUE CROSS/BLUE SHIELD | Attending: Internal Medicine

## 2017-03-24 ENCOUNTER — Other Ambulatory Visit (HOSPITAL_COMMUNITY): Payer: Self-pay | Admitting: *Deleted

## 2017-03-24 DIAGNOSIS — J439 Emphysema, unspecified: Secondary | ICD-10-CM | POA: Insufficient documentation

## 2017-03-24 DIAGNOSIS — R0609 Other forms of dyspnea: Secondary | ICD-10-CM | POA: Diagnosis not present

## 2017-03-26 ENCOUNTER — Encounter: Payer: BLUE CROSS/BLUE SHIELD | Admitting: Speech Pathology

## 2017-04-02 ENCOUNTER — Encounter: Payer: BLUE CROSS/BLUE SHIELD | Admitting: Speech Pathology

## 2017-04-09 ENCOUNTER — Encounter: Payer: BLUE CROSS/BLUE SHIELD | Admitting: Speech Pathology

## 2017-04-30 ENCOUNTER — Encounter: Payer: Self-pay | Admitting: Internal Medicine

## 2017-04-30 NOTE — Telephone Encounter (Signed)
Pt is requesting CPST results. MR please advise. Thanks.

## 2017-05-05 NOTE — Telephone Encounter (Signed)
  Per the cpst test reader  Agree with above. Functional capacity is in low normal range. Resting spirometry shows mixed restrictive/obstructive picture. At peak exercise he is limited by his ventilation. No obvious circulatory limitation however VE/VCO2 mildly elevated which can be seen in COPD

## 2017-08-04 ENCOUNTER — Ambulatory Visit: Payer: BLUE CROSS/BLUE SHIELD | Admitting: Adult Health

## 2017-08-04 ENCOUNTER — Encounter: Payer: Self-pay | Admitting: Adult Health

## 2017-08-04 ENCOUNTER — Telehealth: Payer: Self-pay | Admitting: Cardiology

## 2017-08-04 VITALS — BP 128/78 | HR 74 | Ht 70.0 in | Wt 199.0 lb

## 2017-08-04 DIAGNOSIS — I10 Essential (primary) hypertension: Secondary | ICD-10-CM | POA: Diagnosis not present

## 2017-08-04 DIAGNOSIS — F419 Anxiety disorder, unspecified: Secondary | ICD-10-CM | POA: Diagnosis not present

## 2017-08-04 DIAGNOSIS — R002 Palpitations: Secondary | ICD-10-CM

## 2017-08-04 DIAGNOSIS — Z720 Tobacco use: Secondary | ICD-10-CM

## 2017-08-04 DIAGNOSIS — I2510 Atherosclerotic heart disease of native coronary artery without angina pectoris: Secondary | ICD-10-CM

## 2017-08-04 MED ORDER — NITROGLYCERIN 0.3 MG SL SUBL: 0.3000 mg | SUBLINGUAL_TABLET | SUBLINGUAL | 3 refills | Status: DC | PRN

## 2017-08-04 NOTE — Telephone Encounter (Signed)
Returned call to patient.He stated he has been having episodes of chest pain,perspires heavy,elevated B/P for the past 2 weeks.Appointment scheduled with Jory Sims DNP this afternoon at 2:00 pm.

## 2017-08-04 NOTE — Patient Instructions (Addendum)
Medication Instructions:  NO CHANGES-Your physician recommends that you continue on your current medications as directed. Please refer to the Current Medication list given to you today.  If you need a refill on your cardiac medications before your next appointment, please call your pharmacy.  Your physician has recommended that you wear a 7-DAY event monitor. Event monitors are medical devices that record the heart's electrical activity. Doctors most often [place] these monitors to diagnose arrhythmias. Arrhythmias are problems with the speed or rhythm of the heartbeat. The monitor is a small, portable device. You can wear one while you do your normal daily activities. This is usually used to diagnose what is causing palpitations/syncope (passing out).   Follow-Up: Your physician wants you to follow-up in: CHANGE SCHEDULED APPT TO F/U IN ONE MONTH WITH DR Ellyn Hack -OR- Jory Sims, DNP.    Thank you for choosing [facility] at [facility]!!
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
## 2017-08-04 NOTE — Telephone Encounter (Signed)
New message    Pt c/o of Chest Pain: STAT if CP now or developed within 24 hours  1. Are you having CP right now? PER PT "NOT A LOT" RIGHT NOW  2. Are you experiencing any other symptoms (ex. SOB, nausea, vomiting, sweating)?SOB, NAUSEA  3. How long have you been experiencing CP? 2 WEEKS  4. Is your CP continuous or coming and going?CONTINUOUS 5. Have you taken Nitroglycerin? NO ?

## 2017-08-07 ENCOUNTER — Ambulatory Visit: Payer: BLUE CROSS/BLUE SHIELD | Admitting: Cardiology

## 2017-08-12 ENCOUNTER — Ambulatory Visit (INDEPENDENT_AMBULATORY_CARE_PROVIDER_SITE_OTHER): Payer: BLUE CROSS/BLUE SHIELD

## 2017-08-12 DIAGNOSIS — R002 Palpitations: Secondary | ICD-10-CM | POA: Diagnosis not present

## 2017-09-11 ENCOUNTER — Encounter: Payer: Self-pay | Admitting: Cardiology

## 2017-09-11 ENCOUNTER — Ambulatory Visit: Payer: BLUE CROSS/BLUE SHIELD | Admitting: Cardiology

## 2017-09-11 VITALS — BP 114/78 | HR 68 | Ht 70.0 in | Wt 197.0 lb

## 2017-09-11 DIAGNOSIS — I2510 Atherosclerotic heart disease of native coronary artery without angina pectoris: Secondary | ICD-10-CM

## 2017-09-11 DIAGNOSIS — Z720 Tobacco use: Secondary | ICD-10-CM

## 2017-09-11 DIAGNOSIS — R002 Palpitations: Secondary | ICD-10-CM

## 2017-09-11 NOTE — Patient Instructions (Addendum)
MEDICATION INSTRUCTIONS  KEEP  HYDRATED--  8 to 10 glasses a day URINE SHOULD BE CLEAR   Keep a log of Blood Pressure  When an episode occurs try an take some deep breathes if last longer than 10 -15 min You  can take a XANAX  If needed.   Your physician recommends that you schedule a follow-up appointment in SEPT 16, 2019 AT 8 AM WITH DR HARDING.
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
## 2017-09-13 ENCOUNTER — Encounter: Payer: Self-pay | Admitting: Cardiology

## 2017-09-13 DIAGNOSIS — R002 Palpitations: Secondary | ICD-10-CM | POA: Insufficient documentation

## 2017-09-13 NOTE — Assessment & Plan Note (Signed)
He has had a negative stress test relatively recently along with a cardiac catheterization that showed nonobstructive disease.  Unlikely that his current chest tightness is related to coronary disease since it is not exertional and very short episodic. I reassured him that if this is unlikely to be cardiac chest pain.

## 2017-09-13 NOTE — Assessment & Plan Note (Signed)
Evaluated with monitor that did not show any arrhythmias or PACs or PVCs that would be concerning.  I encouraged him that these symptoms he is describing is probably related to mild anxiety attacks.  Also recommended hydration. Discussed with PCP possibility of increasing his Paxil dose.  Would avoid using Xanax unless symptoms are prolonged.

## 2017-09-13 NOTE — Assessment & Plan Note (Signed)
Smoking cessation counseling provided.  He seems interested, but not yet ready to quit.

## 2017-11-09 ENCOUNTER — Encounter: Payer: Self-pay | Admitting: Cardiology

## 2017-12-14 DIAGNOSIS — G540 Brachial plexus disorders: Secondary | ICD-10-CM

## 2017-12-14 HISTORY — DX: Brachial plexus disorders: G54.0

## 2018-03-01 ENCOUNTER — Ambulatory Visit: Payer: BLUE CROSS/BLUE SHIELD | Admitting: Cardiology

## 2018-03-01 ENCOUNTER — Encounter: Payer: Self-pay | Admitting: Cardiology

## 2018-03-01 VITALS — BP 126/82 | HR 82 | Ht 70.0 in | Wt 209.0 lb

## 2018-03-01 DIAGNOSIS — R002 Palpitations: Secondary | ICD-10-CM

## 2018-03-01 DIAGNOSIS — R0609 Other forms of dyspnea: Secondary | ICD-10-CM

## 2018-03-01 DIAGNOSIS — I2510 Atherosclerotic heart disease of native coronary artery without angina pectoris: Secondary | ICD-10-CM | POA: Diagnosis not present

## 2018-03-01 DIAGNOSIS — F17210 Nicotine dependence, cigarettes, uncomplicated: Secondary | ICD-10-CM

## 2018-03-01 DIAGNOSIS — Z720 Tobacco use: Secondary | ICD-10-CM

## 2018-03-01 NOTE — Patient Instructions (Signed)
No medication changes    KEEP HYDRATED - DRINK 8-10 GLASSES OF WATER A DAY.     Your physician wants you to follow-up in [place]. You will receive a reminder letter in the mail two months in advance. If you don't receive a letter, please call our office to schedule the follow-up appointment.    If you need a refill on your cardiac medications before your next appointment, please call your pharmacy.
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
## 2018-03-03 ENCOUNTER — Encounter: Payer: Self-pay | Admitting: Cardiology

## 2018-03-03 NOTE — Assessment & Plan Note (Addendum)
Much improved from before, never really fully evaluated.  Right heart cath numbers are normal. Has not followed up with pulmonology in over a year. Apparently PFTs are pretty normal, but I still suspect this is probably pulmonary nature.

## 2018-03-03 NOTE — Assessment & Plan Note (Signed)
Pretty well controlled.  Seems to be more related to his anxiety type attacks.  Very short-lived.  We decided not to do beta-blocker in the past although this would be a relatively reasonable option for her as needed if his symptoms worsen.  Otherwise.  Would just simply monitor.

## 2018-03-03 NOTE — Assessment & Plan Note (Signed)
Nonischemic Myoview in the past followed by heart catheterization with no significant CAD. Still having intermittent episodes of chest discomfort which seem to be probably more musculoskeletal in nature.  Overall relatively stable. We talked about smoking cessation. He is on a statin.  Not currently on beta-blocker.

## 2018-03-03 NOTE — Assessment & Plan Note (Signed)
We talked about different options are quitting smoking including nicotine patches, Nicorette gum and even to the point of using E cigarettes.  I used the impetus of his father's cardiac condition prior to dying as a reason for quitting.

## 2018-05-21 ENCOUNTER — Ambulatory Visit: Payer: BLUE CROSS/BLUE SHIELD | Admitting: Internal Medicine

## 2018-05-21 ENCOUNTER — Encounter: Payer: Self-pay | Admitting: Internal Medicine

## 2018-05-21 VITALS — BP 118/60 | HR 75 | Ht 70.0 in | Wt 206.6 lb

## 2018-05-21 DIAGNOSIS — J439 Emphysema, unspecified: Secondary | ICD-10-CM

## 2018-05-21 DIAGNOSIS — R05 Cough: Secondary | ICD-10-CM | POA: Diagnosis not present

## 2018-05-21 DIAGNOSIS — R058 Other specified cough: Secondary | ICD-10-CM

## 2018-05-21 DIAGNOSIS — F17210 Nicotine dependence, cigarettes, uncomplicated: Secondary | ICD-10-CM | POA: Diagnosis not present

## 2018-05-21 LAB — NITRIC OXIDE: NITRIC OXIDE: 5

## 2018-05-21 MED ORDER — GLYCOPYRROLATE-FORMOTEROL 9-4.8 MCG/ACT IN AERO: 2.0000 | INHALATION_SPRAY | Freq: Two times a day (BID) | RESPIRATORY_TRACT | 0 refills | Status: DC

## 2018-05-21 NOTE — Addendum Note (Signed)
Addended by: Suzzanne Cloud E on: 05/21/2018 11:31 AM   Modules accepted: Orders

## 2018-05-21 NOTE — Patient Instructions (Signed)
Upper airway cough syndrome with ? cough variant asthma vs vcd - Plan: Nitric oxide Pulmonary emphysema, unspecified emphysema type (HCC) Smoking greater than 20 pack years  - stable but continued symptoms  Plan - take 2 samples of an anticholinergic inhaler (bevespi)  and take it 2 puff two times daily and see if this helps symptoms  - check alpha 1 AT genetic test - work on quitting smoking - albuterol as needed - continue spine and thoracic outlet syndrome care  Followup 9 months or sooner if needed

## 2018-05-21 NOTE — Addendum Note (Signed)
Addended by: Len Blalock on: 05/21/2018 11:26 AM   Modules accepted: Orders
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
## 2018-05-26 LAB — ALPHA-1 ANTITRYPSIN PHENOTYPE: A1 ANTITRYPSIN SER: 151 mg/dL (ref 83–199)

## 2018-05-26 NOTE — Telephone Encounter (Signed)
Is fine with me. DO not anticipate pulmonary toxicity from this

## 2018-05-26 NOTE — Telephone Encounter (Signed)
MR, Please advise on O2 at 15L. Thanks.

## 2018-07-14 MED ORDER — TIOTROPIUM BROMIDE-OLODATEROL 2.5-2.5 MCG/ACT IN AERS: 2.0000 | INHALATION_SPRAY | Freq: Every day | RESPIRATORY_TRACT | 0 refills | Status: DC

## 2018-07-14 NOTE — Telephone Encounter (Signed)
Please give him sample of either stiolto or anoro and have him give feedback if any of these are helping - indic - emphysema

## 2018-08-30 MED ORDER — TIOTROPIUM BROMIDE-OLODATEROL 2.5-2.5 MCG/ACT IN AERS: 2.0000 | INHALATION_SPRAY | Freq: Every day | RESPIRATORY_TRACT | 3 refills | Status: DC

## 2019-02-10 ENCOUNTER — Other Ambulatory Visit: Payer: Self-pay | Admitting: Adult Health

## 2022-05-06 ENCOUNTER — Encounter: Payer: Self-pay | Admitting: Gastroenterology

## 2022-06-18 ENCOUNTER — Other Ambulatory Visit (INDEPENDENT_AMBULATORY_CARE_PROVIDER_SITE_OTHER): Payer: Medicare PPO

## 2022-06-18 ENCOUNTER — Other Ambulatory Visit: Payer: Self-pay

## 2022-06-18 ENCOUNTER — Ambulatory Visit: Payer: Medicare PPO | Admitting: Gastroenterology

## 2022-06-18 ENCOUNTER — Encounter: Payer: Self-pay | Admitting: Gastroenterology

## 2022-06-18 VITALS — BP 138/62 | HR 87 | Ht 70.0 in | Wt 209.0 lb

## 2022-06-18 DIAGNOSIS — R194 Change in bowel habit: Secondary | ICD-10-CM

## 2022-06-18 DIAGNOSIS — K8020 Calculus of gallbladder without cholecystitis without obstruction: Secondary | ICD-10-CM

## 2022-06-18 DIAGNOSIS — R933 Abnormal findings on diagnostic imaging of other parts of digestive tract: Secondary | ICD-10-CM

## 2022-06-18 DIAGNOSIS — Z86010 Personal history of colonic polyps: Secondary | ICD-10-CM

## 2022-06-18 LAB — FERRITIN: Ferritin: 27.5 ng/mL (ref 22.0–322.0)

## 2022-06-18 LAB — HEPATIC FUNCTION PANEL
ALT: 32 U/L (ref 0–53)
AST: 26 U/L (ref 0–37)
Albumin: 4.6 g/dL (ref 3.5–5.2)
Alkaline Phosphatase: 79 U/L (ref 39–117)
Bilirubin, Direct: 0.1 mg/dL (ref 0.0–0.3)
Total Bilirubin: 0.5 mg/dL (ref 0.2–1.2)
Total Protein: 6.9 g/dL (ref 6.0–8.3)

## 2022-06-18 LAB — IBC + FERRITIN
Ferritin: 27.5 ng/mL (ref 22.0–322.0)
Iron: 111 ug/dL (ref 42–165)
Saturation Ratios: 26.3 % (ref 20.0–50.0)
TIBC: 421.4 ug/dL (ref 250.0–450.0)
Transferrin: 301 mg/dL (ref 212.0–360.0)

## 2022-06-18 MED ORDER — NA SULFATE-K SULFATE-MG SULF 17.5-3.13-1.6 GM/177ML PO SOLN: 1.0000 | Freq: Once | ORAL | 0 refills | Status: AC

## 2022-06-18 NOTE — Patient Instructions (Signed)
_______________________________________________________  If you are age 50 or older, your body mass index should be between 23-30. Your Body mass index is 29.99 kg/m. If this is out of the aforementioned range listed, please consider follow up with your Primary Care Provider.  If you are age 50 or younger, your body mass index should be between 19-25. Your Body mass index is 29.99 kg/m. If this is out of the aformentioned range listed, please consider follow up with your Primary Care Provider.   Your provider has requested that you go to the basement level for lab work before leaving today. Press "B" on the elevator. The lab is located at the first door on the left as you exit the elevator.  You have been scheduled for a colonoscopy. Please follow written instructions given to you at your visit today.  Please pick up your prep supplies at the pharmacy within the next 1-3 days. If you use inhalers (even only as needed), please bring them with you on the day of your procedure.  The  GI providers would like to encourage you to use [facility] to communicate with providers for non-urgent requests or questions.  Due to long hold times on the telephone, sending your provider a message by [facility] may be a faster and more efficient way to get a response.  Please allow 48 business hours for a response.  Please remember that this is for non-urgent requests.   Due to recent changes in healthcare laws, you may see the results of your imaging and laboratory studies on MyChart before your provider has had a chance to review them.  We understand that in some cases there may be results that are confusing or concerning to you. Not all laboratory results come back in the same time frame and the provider may be waiting for multiple results in order to interpret others.  Please give [place] 48 hours in order for your provider to thoroughly review all the results before contacting the office for clarification of your  results.   It was a pleasure to see you today!  Thank you for trusting me with your gastrointestinal care!
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
## 2022-06-21 LAB — IGA: Immunoglobulin A: 225 mg/dL (ref 47–310)

## 2022-06-21 LAB — HEPATITIS B SURFACE ANTIGEN: Hepatitis B Surface Ag: NONREACTIVE

## 2022-06-21 LAB — ANTI-SMOOTH MUSCLE ANTIBODY, IGG: Actin (Smooth Muscle) Antibody (IGG): <20 U (ref <20)

## 2022-06-21 LAB — MITOCHONDRIAL ANTIBODIES: Mitochondrial M2 Ab, IgG: <=20 U (ref <=20.0)

## 2022-06-21 LAB — ANA: Anti Nuclear Antibody (ANA): NEGATIVE

## 2022-06-21 LAB — ALPHA-1-ANTITRYPSIN: A-1 Antitrypsin, Ser: 129 mg/dL (ref 83–199)

## 2022-06-21 LAB — HEPATITIS B SURFACE ANTIBODY,QUALITATIVE: Hep B S Ab: NONREACTIVE

## 2022-06-21 LAB — TISSUE TRANSGLUTAMINASE, IGA: (tTG) Ab, IgA: <1 U/mL

## 2022-06-21 LAB — HEPATITIS A ANTIBODY, TOTAL: Hepatitis A AB,Total: NONREACTIVE

## 2022-06-21 LAB — HEPATITIS C ANTIBODY: Hepatitis C Ab: NONREACTIVE

## 2022-06-21 LAB — CERULOPLASMIN: Ceruloplasmin: 23 mg/dL (ref 18–36)

## 2022-07-17 ENCOUNTER — Encounter: Payer: Self-pay | Admitting: Certified Registered Nurse Anesthetist

## 2022-07-17 ENCOUNTER — Encounter: Payer: Self-pay | Admitting: Gastroenterology

## 2022-07-24 ENCOUNTER — Encounter: Payer: Self-pay | Admitting: Gastroenterology

## 2022-07-24 ENCOUNTER — Ambulatory Visit (AMBULATORY_SURGERY_CENTER): Payer: Medicare PPO | Admitting: Gastroenterology

## 2022-07-24 VITALS — BP 122/71 | HR 66 | Temp 98.2°F | Resp 13 | Ht 70.0 in | Wt 209.0 lb

## 2022-07-24 DIAGNOSIS — D120 Benign neoplasm of cecum: Secondary | ICD-10-CM

## 2022-07-24 DIAGNOSIS — D123 Benign neoplasm of transverse colon: Secondary | ICD-10-CM | POA: Diagnosis not present

## 2022-07-24 DIAGNOSIS — Z09 Encounter for follow-up examination after completed treatment for conditions other than malignant neoplasm: Secondary | ICD-10-CM

## 2022-07-24 DIAGNOSIS — D128 Benign neoplasm of rectum: Secondary | ICD-10-CM

## 2022-07-24 DIAGNOSIS — D129 Benign neoplasm of anus and anal canal: Secondary | ICD-10-CM

## 2022-07-24 DIAGNOSIS — Z86010 Personal history of colonic polyps: Secondary | ICD-10-CM

## 2022-07-24 MED ORDER — SODIUM CHLORIDE 0.9 % IV SOLN: 500.0000 mL | Freq: Once | INTRAVENOUS | Status: DC
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
## 2022-07-24 NOTE — Progress Notes (Signed)
Report given to PACU, vss 

## 2022-07-24 NOTE — Op Note (Signed)
[facility] Patient Name: Kevin Ferguson Procedure Date: 07/24/2022 2:01 PM MRN: [id number] Endoscopist: [place]. Loletha Carrow , MD, [id number] Age: 52 Referring MD:  Date of Birth: 25-Apr-1971 Gender: Male Account #: [id number] Procedure:                Colonoscopy Indications:              Surveillance: Personal history of adenomatous                            polyps on last colonoscopy > 5 years ago                           3 tubular adenomas last colonoscopy in 2017                            (outside practice).                           Tubular adenoma 2014 (outside practice)                           Patient reports his father had colon polyps, which                            was the reason for early age of polyp screening for                            this patient. Medicines:                Monitored Anesthesia Care Procedure:                Pre-Anesthesia Assessment:                           - Prior to the procedure, a History and Physical                            was performed, and patient medications and                            allergies were reviewed. The patient's tolerance of                            previous anesthesia was also reviewed. The risks                            and benefits of the procedure and the sedation                            options and risks were discussed with the patient.                            All questions were answered, and informed consent  was obtained. Prior Anticoagulants: The patient has                            taken no anticoagulant or antiplatelet agents. ASA                            Grade Assessment: III - A patient with severe                            systemic disease. After reviewing the risks and                            benefits, the patient was deemed in satisfactory                            condition to undergo the procedure.                           After obtaining  informed consent, the colonoscope                            was passed under direct vision. Throughout the                            procedure, the patient's blood pressure, pulse, and                            oxygen saturations were monitored continuously. The                            CF [id number] #[id number] was introduced through the anus                            and advanced to the the cecum, identified by                            appendiceal orifice and ileocecal valve. The                            colonoscopy was somewhat difficult due to a                            redundant colon. Successful completion of the                            procedure was aided by using manual pressure and                            straightening and shortening the scope to obtain                            bowel loop reduction. The patient tolerated the  procedure well. The quality of the bowel                            preparation was fair in some areas despite lavage.                            The ileocecal valve, appendiceal orifice, and                            rectum were photographed. The bowel preparation                            used was SUPREP via split dose instruction. Scope In: 2:07:37 PM Scope Out: 2:25:59 PM Scope Withdrawal Time: 0 hours 13 minutes 41 seconds  Total Procedure Duration: 0 hours 18 minutes 22 seconds  Findings:                 Repeat examination of right colon under NBI                            performed.                           The sigmoid colon was redundant.                           Four sessile polyps were found in the rectum(1),                            transverse colon(1) and cecum(2). The polyps were 3                            to 5 mm in size. These polyps were removed with a                            cold snare. Resection and retrieval were complete.                           Internal hemorrhoids were found.                            The exam was otherwise without abnormality on                            direct and retroflexion views. Complications:            No immediate complications. Estimated Blood Loss:     Estimated blood loss was minimal. Impression:               - Preparation of the colon was fair.                           - Redundant colon.                           - Four 3 to 5 mm polyps  in the rectum, in the                            transverse colon and in the cecum, removed with a                            cold snare. Resected and retrieved.                           - Internal hemorrhoids.                           - The examination was otherwise normal on direct                            and retroflexion views. Recommendation:           - Patient has a contact number available for                            emergencies. The signs and symptoms of potential                            delayed complications were discussed with the                            patient. Return to normal activities tomorrow.                            Written discharge instructions were provided to the                            patient.                           - Resume previous diet.                           - Continue present medications.                           - Await pathology results.                           - Repeat colonoscopy is recommended for                            surveillance. The colonoscopy date will be                            determined after pathology results from today's                            exam become available for review. [place]  Laurian Brim, MD 07/24/2022 2:33:35 PM This report has been signed electronically.

## 2022-07-24 NOTE — Patient Instructions (Addendum)
Resume previous diet. Continue present medications. Await pathology results. Repeat colonoscopy is recommended for surveillance. The colonoscopy date will be determined after pathology results from today's exam become available for review. GOLYTELY PREP FOR  NEXT EXAM   YOU HAD AN ENDOSCOPIC PROCEDURE TODAY AT [facility]:   Refer to the procedure report that was given to you for any specific questions about what was found during the examination.  If the procedure report does not answer your questions, please call your gastroenterologist to clarify.  If you requested that your care partner not be given the details of your procedure findings, then the procedure report has been included in a sealed envelope for you to review at your convenience later.  YOU SHOULD EXPECT: Some feelings of bloating in the abdomen. Passage of more gas than usual.  Walking can help get rid of the air that was put into your GI tract during the procedure and reduce the bloating. If you had a lower endoscopy (such as a colonoscopy or flexible sigmoidoscopy) you may notice spotting of blood in your stool or on the toilet paper. If you underwent a bowel prep for your procedure, you may not have a normal bowel movement for a few days.  Please Note:  You might notice some irritation and congestion in your nose or some drainage.  This is from the oxygen used during your procedure.  There is no need for concern and it should clear up in a day or so.  SYMPTOMS TO REPORT IMMEDIATELY:  Following lower endoscopy (colonoscopy or flexible sigmoidoscopy):  Excessive amounts of blood in the stool  Significant tenderness or worsening of abdominal pains  Swelling of the abdomen that is new, acute  Fever of 100F or higher  For urgent or emergent issues, a gastroenterologist can be reached at any hour by calling [phone]. Do not use MyChart messaging for urgent concerns.    DIET:  We do recommend a small meal  at first, but then you may proceed to your regular diet.  Drink plenty of fluids but you should avoid alcoholic beverages for 24 hours.  ACTIVITY:  You should plan to take it easy for the rest of today and you should NOT DRIVE or use heavy machinery until tomorrow (because of the sedation medicines used during the test).    FOLLOW UP: Our staff will call the number listed on your records the next business day following your procedure.  We will call around 7:15- 8:00 am to check on you and address any questions or concerns that you may have regarding the information given to you following your procedure. If we do not reach you, we will leave a message.     If any biopsies were taken you will be contacted by phone or by letter within the next 1-3 weeks.  Please call us at [phone] if you have not heard about the biopsies in 3 weeks.    SIGNATURES/CONFIDENTIALITY: You and/or your care partner have signed paperwork which will be entered into your electronic medical record.  These signatures attest to the fact that that the information above on your After Visit Summary has been reviewed and is understood.  Full responsibility of the confidentiality of this discharge information lies with you and/or your care-partner.

## 2022-07-24 NOTE — Progress Notes (Signed)
1422 HR > 100 with esmolol 25 mg given IV, MD updated, vss

## 2022-07-24 NOTE — Progress Notes (Signed)
Pt's states no medical or surgical changes since previsit or office visit. 

## 2022-07-24 NOTE — Progress Notes (Signed)
Called to room to assist during endoscopic procedure.  Patient ID and intended procedure confirmed with present staff. Received instructions for my participation in the procedure from the performing physician.  

## 2022-07-25 ENCOUNTER — Telehealth: Payer: Self-pay | Admitting: *Deleted

## 2022-07-25 NOTE — Telephone Encounter (Signed)
  Follow up Call-     07/24/2022    1:18 PM  Call back number  Post procedure Call Back phone  # [phone]  Permission to leave phone message Yes     Patient questions:  Do you have a fever, pain , or abdominal swelling? No. Pain Score  0 *  Have you tolerated food without any problems? Yes.    Have you been able to return to your normal activities? Yes.    Do you have any questions about your discharge instructions: Diet   No. Medications  No. Follow up visit  No.  Do you have questions or concerns about your Care? No.  Actions: * If pain score is 4 or above: No action needed, pain <4.

## 2022-08-04 ENCOUNTER — Encounter: Payer: Self-pay | Admitting: Gastroenterology

## 2023-02-16 ENCOUNTER — Encounter: Payer: Self-pay | Admitting: Cardiology

## 2023-04-19 NOTE — Progress Notes (Unsigned)
Cardiology Office Note:  .   Date:  04/23/2023  ID:  Kevin Ferguson, DOB August 30, 1970, MRN [id number] PCP: Jerrye Bushy, FNP  [facility] Providers Cardiologist:  Bryan Lemma, MD     Chief Complaint  Patient presents with   New Patient (Initial Visit)    Reestablish cardiology care   Patient Profile: .     Kevin Ferguson is a  52 y.o. male (former patient of mine named Patent attorney) smoker with a PMH notable for nonobstructive CAD, DM-2 with neuropathy, HLD, COPD/ILD, OSA-CPAP who presents here To Reestablish Cardiology Care at the request of Jerrye Bushy, FNP.   I last saw Kevin Ferguson in Sept of 2019  Kevin Ferguson was last seen 03/11/2021 by Dr. Ginger Carne from [facility]: Patient had noted 2 episodes of passing out-both at home while standing.  He got dizzy and fell before.  Episodes are witnessed by his family and last for few seconds.  No loss of bowel or bladder and no seizure-like activity.  He denied any cardiac symptoms palpitations, PND orthopnea, edema or chest pain/pressure with rest or exertion.  Still smoking.  => They discussed an event monitor, but the episodes were not very frequent and would not likely catch anything.  Recommended annual follow-up. => Plan was for annual follow-up and to reassess syncope if there were to be any recurrence.  Subjective  Discussed the use of AI scribe software for clinical note transcription with the patient, who gave verbal consent to proceed.  History of Present Illness   The patient, with a history of hypertension, diabetes, and COPD, presents for a routine follow-up after a lapse in insurance coverage. He reports a recent increase in blood pressure readings over the past few months, with values reaching as high as 167/102, compared to his usual range of 130-135/60. He denies any associated symptoms such as palpitations, chest pain, or dizziness.  The patient also reports occasional exertional  dyspnea, describing it as a feeling of tightness and difficulty catching his breath. He attributes this to his known COPD and possibly his elevated blood pressure. He denies any associated chest pain.  He also reports a history of syncope, with the most recent episode occurring last year. During this episode, he experienced a sudden onset of dizziness and tingling, followed by a blackout. He denies any associated palpitations or chest pain during these episodes.  The patient has been managing his diabetes with metformin and reports recent blood glucose levels between 125 and 130. He also reports occasional swelling in his left leg and foot, but denies any associated pain or discomfort.  The patient has a history of back and neck pain, which he manages with tramadol and meloxicam as needed. He also reports a recent colonoscopy due to ongoing stomach issues, during which three polyps and a cyst were found but were determined to be benign.  The patient quit smoking in November 2021 and has not resumed. He is currently on CPAP for sleep apnea but reports that his machine is malfunctioning. He has not had a sleep study in approximately five years.  The patient's current medication regimen includes verapamil for hypertension, metformin for diabetes, rosuvastatin for cholesterol management, pregabalin for neuropathic pain, and meloxicam and tramadol for pain management. He also takes Lexapro for mental health, Nexium for GERD, and uses an albuterol inhaler as needed for COPD. He denies taking Imdur, a long-acting nitrate, despite it being listed in his medication history.  Cardiovascular ROS: positive for - chest pain, dyspnea on exertion, loss of consciousness, palpitations, shortness of breath, and (Last LOC was > 1 year ago); CP is more of a tightness associated with SOB; rare palpitations negative for - edema, orthopnea, paroxysmal nocturnal dyspnea, rapid heart rate, or TIA/CVA/amaurosis fugax,  claudication  ROS:  Review of Systems - Negative except diffuse joint pains -back & neck; RLE swells on occasion.  Off & on upset stomach - recent C-scope normal.   Objective   Family History notable for father diagnosed with severe CAD at age 52, along with stroke, COPD, HTN, HLD, DM-2 and CKD.  Mother has hyperlipidemia and hypothyroidism.  Current Meds  Medication Sig   albuterol (VENTOLIN HFA) 108 (90 Base) MCG/ACT inhaler Inhale 1 puff into the lungs every 6 (six) hours as needed.   ALPRAZolam (XANAX) 0.25 MG tablet Take 0.25 mg by mouth daily as needed for anxiety.    esomeprazole (NEXIUM) 40 MG capsule Take 20 mg by mouth daily.   Fluticasone-Umeclidin-Vilant (TRELEGY ELLIPTA) 100-62.5-25 MCG/ACT AEPB Inhale 1 puff into the lungs daily.   glucose blood (CONTOUR NEXT TEST) test strip    ipratropium-albuterol (DUONEB) 0.5-2.5 (3) MG/3ML SOLN Inhale 3 mLs into the lungs every 6 (six) hours as needed.   meloxicam (MOBIC) 15 MG tablet Take 15 mg by mouth daily.   metFORMIN (GLUCOPHAGE) 1000 MG tablet Take 1,000 mg by mouth 2 (two) times daily.   pregabalin (LYRICA) 225 MG capsule Take 225 mg by mouth 2 (two) times daily.   rosuvastatin (CRESTOR) 20 MG tablet    topiramate (TOPAMAX) 200 MG tablet Take 400 mg by mouth at bedtime.   traMADol (ULTRAM) 50 MG tablet Take by mouth every 6 (six) hours as needed.   verapamil (VERELAN PM) 240 MG 24 hr capsule Take 240 mg by mouth daily.  Needs to get new CPAP machine  Studies Reviewed: Marland Kitchen   EKG Interpretation Date/Time:  Monday April 20 2023 08:05:01 EST Ventricular Rate:  69 PR Interval:  204 QRS Duration:  88 QT Interval:  414 QTC Calculation: 443 R Axis:   8  Text Interpretation: Normal sinus rhythm Normal ECG When compared with ECG of 05-Jul-2015 09:00, No significant change was found Confirmed by Bryan Lemma ([id number]) on 04/20/2023 8:12:42 AM    No recent labs available.  Most recent liver panel back in 2021  Echocardiogram  [facility]) 03/21/2020: Normal LV size and function.  EF 55%.  AOV sclerosis with no stenosis.  Mild MAC.  Event monitor 08/12/2017: Sinus rhythm with PACs and PVCs CPX October 2018: Low normal functional capacity.  Resting spirometry shows mixed restrictive and obstructive picture.  Peak exercise limited ventilation.  No obvious expiratory limitations however the VE/VCO2 mildly elevated which can be seen in COPD or with increased pulmonary pressures.  Right Heart Cath 07/05/2015: Normal right heart cath numbers with normal pressures and EDP.  No evidence of pulmonary hypertension. Echocardiogram 07/13/2015: Normal LV size and function EF 60 to 65%.  Normal valves. CPX 01/09/2015: Limited due to submaximal effort.  Moderate to severe functional impairment.  There does not appear be significant circulatory limitation to exercise.  Spirometry is restrictive.  CATH 01/09/2012: Normal coronaries with normal LV size and function  Risk Assessment/Calculations:         Physical Exam:   VS:  BP (!) 140/88   Pulse 69   Ht 5\' 10"  (1.778 m)   Wt 198 lb 3.2 oz (89.9 kg)   SpO2  98%   BMI 28.44 kg/m    Wt Readings from Last 3 Encounters:  04/20/23 198 lb 3.2 oz (89.9 kg)  07/24/22 209 lb (94.8 kg)  06/18/22 209 lb (94.8 kg)    GEN: Well nourished, well developed in no acute distress; healthy appearing NECK: No JVD; No carotid bruits CARDIAC: Normal S1, S2; RRR, no murmurs, rubs, gallops RESPIRATORY:  Clear to auscultation without rales, wheezing or rhonchi ; nonlabored, good air movement. ABDOMEN: Soft, non-tender, non-distended EXTREMITIES:  No edema; No deformity     ASSESSMENT AND PLAN: .    Problem List Items Addressed This Visit       Cardiology Problems   Coronary artery disease, non-occlusive - Primary (Chronic)    History of nonischemic Myoview after catheterization showing no significant disease.  No active angina symptoms.  Plan: Continue aspirin 81 mg daily along with  rosuvastatin 20 mg daily, verapamil 240 mg daily.  Not on beta-blocker because of COPD-using verapamil instead. Thankfully, he quit smoking.      Relevant Orders   EKG 12-Lead (Completed)   Essential hypertension (Chronic)    Recent increase in blood pressure readings, currently managed with Verapamil. Discussed the potential need for additional antihypertensive medication. -Monitor blood pressure at home a few times a week, increasing to daily in the week before next primary care appointment. -Discuss potential addition of antihypertensive medication such as Valsartan with primary care provider at next appointment.      Hyperlipidemia LDL goal <100 (Chronic)    On rosuvastatin.  Needs labs to be checked.  Hopefully this will be checked by PCP soon.        Other   DOE (dyspnea on exertion) (Chronic)    Pretty stable      Former heavy cigarette smoker (20-39 per day) (Chronic)   Heart palpitations   OSA on CPAP (Chronic)    Needs to get back on CPAP-needs to get in contact with his equipment provider to get new clean equipment.      Pulmonary emphysema (HCC) (Chronic)    Reports of chest tightness and difficulty catching breath with exertion, currently managed with Trelegy and Albuterol inhaler as needed. -Continue current management, stop and rest when experiencing symptoms during exertion.       Chronic Pain Reports chronic joint pain, particularly in back and neck, managed with Pregabalin, Meloxicam, Tramadol as needed, and Indomethacin as needed. -Continue current management.  General Health Maintenance -Continue CPAP for sleep apnea, consider getting a new machine as current one is reportedly malfunctioning. -Quit smoking in November 2021, continue abstinence. -Get blood work checked at next primary care appointment in January.         Follow-Up: Return in about 1 year (around 04/19/2024) for 1 Yr Follow-up.  Total time spent: 33 min spent with patient + 12 min  spent charting = 45 min     Signed, Marykay Lex, MD, MS Bryan Lemma, M.D., M.S. Interventional Cardiologist  [facility]  Pager # [phone] Phone # [phone] [address] [place], [place] [address]

## 2023-04-20 ENCOUNTER — Ambulatory Visit: Payer: Medicare PPO | Attending: Cardiology | Admitting: Cardiology

## 2023-04-20 ENCOUNTER — Encounter: Payer: Self-pay | Admitting: Cardiology

## 2023-04-20 VITALS — BP 140/88 | HR 69 | Ht 70.0 in | Wt 198.2 lb

## 2023-04-20 DIAGNOSIS — R002 Palpitations: Secondary | ICD-10-CM | POA: Diagnosis not present

## 2023-04-20 DIAGNOSIS — I2510 Atherosclerotic heart disease of native coronary artery without angina pectoris: Secondary | ICD-10-CM

## 2023-04-20 DIAGNOSIS — J432 Centrilobular emphysema: Secondary | ICD-10-CM

## 2023-04-20 DIAGNOSIS — G4733 Obstructive sleep apnea (adult) (pediatric): Secondary | ICD-10-CM

## 2023-04-20 DIAGNOSIS — R0609 Other forms of dyspnea: Secondary | ICD-10-CM

## 2023-04-20 DIAGNOSIS — Z87891 Personal history of nicotine dependence: Secondary | ICD-10-CM

## 2023-04-20 DIAGNOSIS — I10 Essential (primary) hypertension: Secondary | ICD-10-CM | POA: Insufficient documentation

## 2023-04-20 DIAGNOSIS — E785 Hyperlipidemia, unspecified: Secondary | ICD-10-CM

## 2023-04-20 NOTE — Patient Instructions (Signed)
Medication Instructions:  No changes    *If you need a refill on your cardiac medications before your next appointment, please call your pharmacy*   Lab Work: Not  needed If you have labs (blood work) drawn today and your tests are completely normal, you will receive your results only by: MyChart Message (if you have MyChart) OR A paper copy in the mail If you have any lab test that is abnormal or we need to change your treatment, we will call you to review the results.   Testing/Procedures: Not needed   Follow-Up: At [facility], you and your health needs are our priority.  As part of our continuing mission to provide you with exceptional heart care, we have created designated Provider Care Teams.  These Care Teams include your primary Cardiologist (physician) and Advanced Practice Providers (APPs -  Physician Assistants and Nurse Practitioners) who all work together to provide you with the care you need, when you need it.     Your next appointment:   12 month(s)  The format for your next appointment:   In Person  Provider:   Bryan Lemma, MD    Other Instructions    Keep  blood pressure  ,  The week prior to your visit with primary -  check blood pressure and take with to your primary.

## 2023-04-23 ENCOUNTER — Encounter: Payer: Self-pay | Admitting: Cardiology

## 2023-04-23 DIAGNOSIS — E785 Hyperlipidemia, unspecified: Secondary | ICD-10-CM | POA: Insufficient documentation

## 2023-04-23 NOTE — Assessment & Plan Note (Signed)
Needs to get back on CPAP-needs to get in contact with his equipment provider to get new clean equipment.

## 2023-04-23 NOTE — Assessment & Plan Note (Signed)
On rosuvastatin.  Needs labs to be checked.  Hopefully this will be checked by PCP soon.

## 2023-04-23 NOTE — Assessment & Plan Note (Signed)
Reports of chest tightness and difficulty catching breath with exertion, currently managed with Trelegy and Albuterol inhaler as needed. -Continue current management, stop and rest when experiencing symptoms during exertion.

## 2023-04-23 NOTE — Assessment & Plan Note (Signed)
History of nonischemic Myoview after catheterization showing no significant disease.  No active angina symptoms.  Plan: Continue aspirin 81 mg daily along with rosuvastatin 20 mg daily, verapamil 240 mg daily.  Not on beta-blocker because of COPD-using verapamil instead. Thankfully, he quit smoking.

## 2023-04-23 NOTE — Assessment & Plan Note (Signed)
Pretty stable

## 2023-04-23 NOTE — Assessment & Plan Note (Signed)
Recent increase in blood pressure readings, currently managed with Verapamil. Discussed the potential need for additional antihypertensive medication. -Monitor blood pressure at home a few times a week, increasing to daily in the week before next primary care appointment. -Discuss potential addition of antihypertensive medication such as Valsartan with primary care provider at next appointment.
